# Patient Record
Sex: Male | Born: 1960
Health system: Southern US, Community
[De-identification: ages and names within clinical notes are randomized; demographics above are authoritative.]

## PROBLEM LIST (undated history)

## (undated) DIAGNOSIS — C689 Malignant neoplasm of urinary organ, unspecified: Secondary | ICD-10-CM

## (undated) DIAGNOSIS — E785 Hyperlipidemia, unspecified: Secondary | ICD-10-CM

## (undated) HISTORY — PX: APPENDECTOMY: SHX54

## (undated) HISTORY — PX: EXPLORATORY LAPAROTOMY: SUR591

---

## 2001-01-08 ENCOUNTER — Encounter: Payer: Self-pay | Admitting: Internal Medicine

## 2001-01-08 ENCOUNTER — Ambulatory Visit (HOSPITAL_COMMUNITY): Admission: RE | Admit: 2001-01-08 | Discharge: 2001-01-08 | Payer: Self-pay | Admitting: Internal Medicine

## 2003-01-26 ENCOUNTER — Encounter: Payer: Self-pay | Admitting: Internal Medicine

## 2003-01-26 ENCOUNTER — Ambulatory Visit (HOSPITAL_COMMUNITY): Admission: RE | Admit: 2003-01-26 | Discharge: 2003-01-26 | Payer: Self-pay | Admitting: Internal Medicine

## 2004-04-03 ENCOUNTER — Ambulatory Visit (HOSPITAL_COMMUNITY): Admission: RE | Admit: 2004-04-03 | Discharge: 2004-04-03 | Payer: Self-pay | Admitting: Internal Medicine

## 2005-01-02 ENCOUNTER — Ambulatory Visit (HOSPITAL_COMMUNITY): Admission: RE | Admit: 2005-01-02 | Discharge: 2005-01-02 | Payer: Self-pay | Admitting: Internal Medicine

## 2006-10-04 ENCOUNTER — Ambulatory Visit: Payer: Self-pay | Admitting: Cardiology

## 2006-10-22 ENCOUNTER — Ambulatory Visit: Payer: Self-pay

## 2010-09-19 NOTE — Assessment & Plan Note (Signed)
[facility]                            CARDIOLOGY OFFICE NOTE   NAME:Glorioso, FINNEUS KANESHIRO                    MRN:          [id number]  DATE:10/04/2006                            DOB:          03/05/1961    CARDIOLOGY CONSULTATION:  I was asked by Dr. Carylon Perches to consult on  Luis Pruitt, a very pleasant 50 year old married white male, who has a  cousin who has prolonged QT syndrome and risk of sudden cardiac death.   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  He does not exercise on a regular basis but  he enjoys walking and playing golf.   He has no previous cardiovascular history.  He has never had any  palpitations, presyncope or syncope.   He has had some atypical chest pain in the past and has had a treadmill  with a Cardiolite by Dr. Ouida Sills about 3 years ago.  He said it was  normal.  I do not have a copy of that.   He denies any symptoms consistent with any arrhythmias.   PAST MEDICAL HISTORY:  He has no dye allergy.  He has no allergy to any  medications.  He is currently on no medications.   His cardiac risk factors include male sex, age, tobacco use of a pack  per day for 30 years, and sedentary lifestyle.  He says his lipids are  good.   He does have a couple of alcoholic beverages per day.  He drinks a  couple of caffeinated beverages per day.   PREVIOUS SURGICAL HISTORY:  Appendectomy with acute pancreatitis and  exploratory surgery in 1980.   FAMILY HISTORY:  Negative for premature coronary disease.  I have a copy  of this letter from his cousin, who his family has apparently a history  of prolonged QT syndrome.   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  He is married and has 2 children.   REVIEW OF SYSTEMS:  Other than the HPI, only positive for allergies and  hay fever.  His other review of systems is  negative.   PHYSICAL EXAMINATION:  VITAL SIGNS:  His blood pressure is 129/80, his  pulse is 65 and regular.  His EKG is reviewed and is normal, including  his QT.  He has a little bit of early repolarization that is commented  on by Dr. Ouida Sills earlier.  He is 6 feet 1 inch and weighs 205 pounds.  HEENT:  Normocephalic, atraumatic.  PERRLA.  Extraocular movements  intact.  Sclerae are clear.  Facial symmetry is normal.  NECK:  Carotids are full.  There is no JVD.  Thyroid is not enlarged.  Trachea is midline.  There are negative bruits.  LUNGS:  Clear to auscultation.  HEART:  Nondisplaced PMI.  There is a normal S1, S2 without murmur, rub,  or gallop.  ABDOMEN:  Soft, good bowel sounds, no midline bruit.  There is no  hepatomegaly.  EXTREMITIES:  There is no  cyanosis, clubbing or edema.  Pulses are  brisk.  NEUROLOGIC:  Intact.  SKIN:  Intact.   I spent about 30 minutes with Alinda Money today.  I do not think he has  prolonged QT syndrome and there is really no true history of sudden  cardiac death in his family that I can gather.   I have reassured him of this today.  He also has no symptoms of ischemic  heart disease at the present time but does have significant risk  factors.  I spent a good amount of time talking to him about symptoms of  angina or ischemia and how to respond or react.  I also talked to him  about acute coronary syndrome and how to respond and react.  I have  encouraged him to stop smoking.  He has a prescription for Chantix but  has not used it.  I have encouraged him to do so.   We will obtain a 2 D echocardiogram to rule out any structural heart  disease or the unlikely diagnosis of a hypertrophic obstructive  cardiomyopathy.  If this is negative, I will see him back on a p.r.n.  basis.  I will turn him over to the good care of Dr. Ouida Sills.     Thomas C. Daleen Squibb, MD, [facility]  Electronically Signed    TCW/MedQ  DD: 10/04/2006  DT: 10/04/2006  Job #: [id number]   cc:    Kingsley Callander. Ouida Sills, MD

## 2010-09-22 NOTE — Procedures (Signed)
NAME:  Luis Pruitt, Luis Pruitt             ACCOUNT NO.:  [id number]   MEDICAL RECORD NO.:  [id number]          PATIENT TYPE:  OUT   LOCATION:  [facility]                          FACILITY:  [facility]   PHYSICIAN:  Kingsley Callander. Ouida Sills, MD       DATE OF BIRTH:  1961-04-08   DATE OF PROCEDURE:  DATE OF DISCHARGE:                                    STRESS TEST   DESCRIPTION OF PROCEDURE:  The patient exercised 12 minutes 31 seconds (31  seconds in stage V of the Bruce protocol) obtaining a maximum heart rate of  182 (103% of the age predicted maximum heart rate) and a work load of 12.8  METs.  He discontinued exercise due to fatigue.  There were no symptoms of  chest pain.  There were no arrhythmias.  There were no ST segment changes  diagnostic of ischemia.  The baseline electrocardiogram revealed normal  sinus rhythm at 74 beats/minute.   IMPRESSION:  No evidence of exercise induced ischemia.     Channing Mutters   ROF/MEDQ  D:  04/03/2004  T:  04/03/2004  Job:  [id number]

## 2011-04-25 ENCOUNTER — Other Ambulatory Visit (HOSPITAL_COMMUNITY): Payer: Self-pay | Admitting: Internal Medicine

## 2011-04-25 ENCOUNTER — Ambulatory Visit (HOSPITAL_COMMUNITY)
Admission: RE | Admit: 2011-04-25 | Discharge: 2011-04-25 | Disposition: A | Discharge status: Home / Self Care | Payer: BC Managed Care – PPO | Source: Ambulatory Visit | Attending: Internal Medicine | Admitting: Internal Medicine

## 2011-04-25 DIAGNOSIS — R059 Cough, unspecified: Secondary | ICD-10-CM | POA: Insufficient documentation

## 2011-04-25 DIAGNOSIS — R918 Other nonspecific abnormal finding of lung field: Secondary | ICD-10-CM | POA: Insufficient documentation

## 2011-04-25 DIAGNOSIS — R05 Cough: Secondary | ICD-10-CM | POA: Insufficient documentation

## 2011-08-01 ENCOUNTER — Encounter (INDEPENDENT_AMBULATORY_CARE_PROVIDER_SITE_OTHER): Payer: Self-pay | Admitting: *Deleted

## 2011-11-09 ENCOUNTER — Other Ambulatory Visit (HOSPITAL_COMMUNITY): Payer: Self-pay | Admitting: Internal Medicine

## 2011-11-09 DIAGNOSIS — R1031 Right lower quadrant pain: Secondary | ICD-10-CM

## 2011-11-19 ENCOUNTER — Ambulatory Visit (HOSPITAL_COMMUNITY)
Admission: RE | Admit: 2011-11-19 | Discharge: 2011-11-19 | Disposition: A | Discharge status: Home / Self Care | Payer: BC Managed Care – PPO | Source: Ambulatory Visit | Attending: Internal Medicine | Admitting: Internal Medicine

## 2011-11-19 DIAGNOSIS — R1031 Right lower quadrant pain: Secondary | ICD-10-CM | POA: Insufficient documentation

## 2012-01-02 ENCOUNTER — Telehealth (INDEPENDENT_AMBULATORY_CARE_PROVIDER_SITE_OTHER): Payer: Self-pay | Admitting: *Deleted

## 2012-01-02 ENCOUNTER — Encounter (INDEPENDENT_AMBULATORY_CARE_PROVIDER_SITE_OTHER): Payer: Self-pay | Admitting: *Deleted

## 2012-01-02 ENCOUNTER — Other Ambulatory Visit (INDEPENDENT_AMBULATORY_CARE_PROVIDER_SITE_OTHER): Payer: Self-pay | Admitting: *Deleted

## 2012-01-02 DIAGNOSIS — Z1211 Encounter for screening for malignant neoplasm of colon: Secondary | ICD-10-CM

## 2012-01-02 DIAGNOSIS — Z800 Family history of malignant neoplasm of digestive organs: Secondary | ICD-10-CM

## 2012-01-02 NOTE — Telephone Encounter (Signed)
Patient needs movi prep 

## 2012-01-04 MED ORDER — PEG-KCL-NACL-NASULF-NA ASC-C 100 G PO SOLR: 1.0000 | Freq: Once | ORAL | Status: DC

## 2012-02-13 ENCOUNTER — Telehealth (INDEPENDENT_AMBULATORY_CARE_PROVIDER_SITE_OTHER): Payer: Self-pay | Admitting: *Deleted

## 2012-02-13 NOTE — Telephone Encounter (Signed)
PCP/Requesting MD: fagan  Name & DOB: alexy heldt 11-01-1960   Procedure: tcs  Reason/Indication:  Screening, fam hx colon ca  Has patient had this procedure before?  no  If so, when, by whom and where?    Is there a family history of colon cancer?  yes  Who?  What age when diagnosed?  father  Is patient diabetic?   no      Does patient have prosthetic heart valve?  no  Do you have a pacemaker?  no  Has patient had joint replacement within last 12 months?  no  Is patient on Coumadin, Plavix and/or Aspirin? no  Medications: tylenol prn  Allergies: nkda  Medication Adjustment:   Procedure date & time: 02/28/12 at 830

## 2012-02-14 NOTE — Telephone Encounter (Signed)
agree

## 2012-02-20 ENCOUNTER — Encounter (HOSPITAL_COMMUNITY): Payer: Self-pay | Admitting: Pharmacy Technician

## 2012-02-27 MED ORDER — SODIUM CHLORIDE 0.45 % IV SOLN
INTRAVENOUS | Status: DC
  Administered 2012-02-28: 1000 mL via INTRAVENOUS

## 2012-02-28 ENCOUNTER — Encounter (HOSPITAL_COMMUNITY): Payer: Self-pay | Admitting: *Deleted

## 2012-02-28 ENCOUNTER — Encounter (HOSPITAL_COMMUNITY)
Admission: RE | Disposition: A | Discharge status: Home Health | Payer: Self-pay | Source: Ambulatory Visit | Attending: Internal Medicine

## 2012-02-28 ENCOUNTER — Ambulatory Visit (HOSPITAL_COMMUNITY)
Admission: RE | Admit: 2012-02-28 | Discharge: 2012-02-28 | Disposition: A | Discharge status: Home Health | Payer: BC Managed Care – PPO | Source: Ambulatory Visit | Attending: Internal Medicine | Admitting: Internal Medicine

## 2012-02-28 DIAGNOSIS — Z800 Family history of malignant neoplasm of digestive organs: Secondary | ICD-10-CM

## 2012-02-28 DIAGNOSIS — K5730 Diverticulosis of large intestine without perforation or abscess without bleeding: Secondary | ICD-10-CM

## 2012-02-28 DIAGNOSIS — D126 Benign neoplasm of colon, unspecified: Secondary | ICD-10-CM | POA: Insufficient documentation

## 2012-02-28 DIAGNOSIS — Z1211 Encounter for screening for malignant neoplasm of colon: Secondary | ICD-10-CM

## 2012-02-28 DIAGNOSIS — K644 Residual hemorrhoidal skin tags: Secondary | ICD-10-CM | POA: Insufficient documentation

## 2012-02-28 HISTORY — PX: COLONOSCOPY: SHX5424

## 2012-02-28 SURGERY — COLONOSCOPY
Anesthesia: Moderate Sedation

## 2012-02-28 MED ORDER — MIDAZOLAM HCL 5 MG/5ML IJ SOLN
INTRAMUSCULAR | Status: DC | PRN
  Administered 2012-02-28 (×3): 2 mg via INTRAVENOUS

## 2012-02-28 MED ORDER — MIDAZOLAM HCL 5 MG/5ML IJ SOLN
INTRAMUSCULAR | Status: AC
  Filled 2012-02-28: qty 10

## 2012-02-28 MED ORDER — MEPERIDINE HCL 50 MG/ML IJ SOLN
INTRAMUSCULAR | Status: DC | PRN
  Administered 2012-02-28 (×2): 25 mg via INTRAVENOUS

## 2012-02-28 MED ORDER — MEPERIDINE HCL 50 MG/ML IJ SOLN
INTRAMUSCULAR | Status: AC
  Filled 2012-02-28: qty 1

## 2012-02-28 NOTE — H&P (Signed)
Luis Pruitt is an 51 y.o. male.   Chief Complaint: Patient is here for colonoscopy. HPI: Patient is 51 year old Caucasian male who is here for screening colonoscopy. This is patient's first exam. He denies abdominal pain change in his bowel habits or rectal bleeding. Family history significant for colon carcinoma in his father but he was in his late 59s at the time of diagnosis.  History reviewed. No pertinent past medical history.  Past Surgical History  Procedure Date  . Exploratory laparotomy     with appendectomy  . Appendectomy     Family History  Problem Relation Age of Onset  . Colon cancer Father    Social History:  reports that he has been smoking Cigarettes.  He has been smoking about 1 pack per day. He does not have any smokeless tobacco history on file. His alcohol and drug histories not on file.  Allergies: No Known Allergies  Medications Prior to Admission  Medication Sig Dispense Refill  . peg 3350 powder (MOVIPREP) 100 G SOLR Take 1 kit (100 g total) by mouth once.  1 kit  0    No results found for this or any previous visit (from the past 48 hour(s)). No results found.  ROS  Blood pressure 131/82, pulse 75, temperature 97.7 F (36.5 C), temperature source Oral, resp. rate 18, height 6\' 1"  (1.854 m), weight 215 lb (97.523 kg), SpO2 97.00%. Physical Exam  Constitutional: He appears well-developed and well-nourished.  HENT:  Mouth/Throat: Oropharynx is clear and moist.  Eyes: Conjunctivae normal are normal. No scleral icterus.  Neck: No thyromegaly present.  Cardiovascular: Normal rate, regular rhythm and normal heart sounds.   No murmur heard. Respiratory: Breath sounds normal.  GI: He exhibits no distension and no mass. There is no tenderness.  Musculoskeletal: He exhibits no edema.  Lymphadenopathy:    He has no cervical adenopathy.  Neurological: He is alert.  Skin: Skin is warm and dry.     Assessment/Plan Screening colonoscopy. Family  history of colon carcinoma in her father who is in his late 33s at the time of diagnosis.   Paymon Rosensteel U 02/28/2012, 8:30 AM

## 2012-02-28 NOTE — Progress Notes (Signed)
Call patient at [phone]

## 2012-02-28 NOTE — Op Note (Signed)
COLONOSCOPY PROCEDURE REPORT  PATIENT:  Luis Pruitt  MR#:  [id number] Birthdate:  1960-06-21, 51 y.o., male Endoscopist:  Dr. Malissa Hippo, MD Referred By:  Dr. Carylon Perches, MD Procedure Date: 02/28/2012  Procedure:   Colonoscopy  Indications:  Patient is 51 year old Caucasian male who is undergoing screening colonoscopy. His risk would be considered ablated since his father had colon carcinoma in his late 22s.  Informed Consent:  The procedure and risks were reviewed with the patient and informed consent was obtained.  Medications:  Demerol 50 mg IV Versed 6 mg IV  Description of procedure:  After a digital rectal exam was performed, that colonoscope was advanced from the anus through the rectum and colon to the area of the cecum, ileocecal valve and appendiceal orifice. The cecum was deeply intubated. These structures were well-seen and photographed for the record. From the level of the cecum and ileocecal valve, the scope was slowly and cautiously withdrawn. The mucosal surfaces were carefully surveyed utilizing scope tip to flexion to facilitate fold flattening as needed. The scope was pulled down into the rectum where a thorough exam including retroflexion was performed.  Findings:   Prep satisfactory. Two small diverticula noted; one at cecum and the second one at sigmoid colon. Two small polyps ablated via cold biopsy and submitted together. These are located at splenic flexure and sigmoid colon. Normal rectal mucosa. Small hemorrhoids below the dentate line.  Therapeutic/Diagnostic Maneuvers Performed:  See above  Complications:  None  Cecal Withdrawal Time:  15 minutes  Impression:  Examination performed to cecum. Two small polyps ablated via cold biopsy and submitted together as above. Two small diverticula. One was at cecum and the second one at sigmoid colon. Small external hemorrhoids.  Recommendations:  Standard instructions given. I will contact patient  with biopsy results and further recommendations.  Sharah Finnell U  02/28/2012 9:07 AM  CC: Dr. Carylon Perches, MD & Dr. Bonnetta Barry ref. provider found

## 2012-03-05 ENCOUNTER — Encounter (HOSPITAL_COMMUNITY): Payer: Self-pay | Admitting: Internal Medicine

## 2012-03-07 ENCOUNTER — Encounter (INDEPENDENT_AMBULATORY_CARE_PROVIDER_SITE_OTHER): Payer: Self-pay | Admitting: *Deleted

## 2013-03-30 ENCOUNTER — Ambulatory Visit (HOSPITAL_COMMUNITY)
Admission: RE | Admit: 2013-03-30 | Discharge: 2013-03-30 | Disposition: A | Discharge status: Home / Self Care | Payer: BC Managed Care – PPO | Source: Ambulatory Visit | Attending: Internal Medicine | Admitting: Internal Medicine

## 2013-03-30 ENCOUNTER — Other Ambulatory Visit (HOSPITAL_COMMUNITY): Payer: Self-pay | Admitting: Internal Medicine

## 2013-03-30 DIAGNOSIS — M25552 Pain in left hip: Secondary | ICD-10-CM

## 2013-03-30 DIAGNOSIS — M25559 Pain in unspecified hip: Secondary | ICD-10-CM | POA: Insufficient documentation

## 2013-06-15 ENCOUNTER — Other Ambulatory Visit (HOSPITAL_COMMUNITY): Payer: Self-pay | Admitting: Internal Medicine

## 2013-06-15 ENCOUNTER — Ambulatory Visit (HOSPITAL_COMMUNITY)
Admission: RE | Admit: 2013-06-15 | Discharge: 2013-06-15 | Disposition: A | Discharge status: Home / Self Care | Payer: BC Managed Care – PPO | Source: Ambulatory Visit | Attending: Internal Medicine | Admitting: Internal Medicine

## 2013-06-15 DIAGNOSIS — R059 Cough, unspecified: Secondary | ICD-10-CM | POA: Insufficient documentation

## 2013-06-15 DIAGNOSIS — R05 Cough: Secondary | ICD-10-CM

## 2014-06-25 ENCOUNTER — Other Ambulatory Visit (HOSPITAL_COMMUNITY): Payer: Self-pay | Admitting: Internal Medicine

## 2014-06-25 ENCOUNTER — Ambulatory Visit (HOSPITAL_COMMUNITY)
Admission: RE | Admit: 2014-06-25 | Discharge: 2014-06-25 | Disposition: A | Discharge status: Home / Self Care | Payer: BLUE CROSS/BLUE SHIELD | Source: Ambulatory Visit | Attending: Internal Medicine | Admitting: Internal Medicine

## 2014-06-25 DIAGNOSIS — W19XXXA Unspecified fall, initial encounter: Secondary | ICD-10-CM | POA: Diagnosis not present

## 2014-06-25 DIAGNOSIS — M25531 Pain in right wrist: Secondary | ICD-10-CM | POA: Diagnosis present

## 2014-06-25 DIAGNOSIS — R52 Pain, unspecified: Secondary | ICD-10-CM

## 2015-02-03 ENCOUNTER — Other Ambulatory Visit (HOSPITAL_COMMUNITY): Payer: Self-pay | Admitting: Internal Medicine

## 2015-02-03 DIAGNOSIS — IMO0001 Reserved for inherently not codable concepts without codable children: Secondary | ICD-10-CM

## 2015-02-09 ENCOUNTER — Ambulatory Visit (HOSPITAL_COMMUNITY)
Admission: RE | Admit: 2015-02-09 | Discharge: 2015-02-09 | Disposition: A | Discharge status: Home / Self Care | Payer: BLUE CROSS/BLUE SHIELD | Source: Ambulatory Visit | Attending: Internal Medicine | Admitting: Internal Medicine

## 2015-02-09 DIAGNOSIS — R932 Abnormal findings on diagnostic imaging of liver and biliary tract: Secondary | ICD-10-CM | POA: Insufficient documentation

## 2015-02-09 DIAGNOSIS — R140 Abdominal distension (gaseous): Secondary | ICD-10-CM | POA: Diagnosis present

## 2015-02-09 DIAGNOSIS — IMO0001 Reserved for inherently not codable concepts without codable children: Secondary | ICD-10-CM

## 2015-09-22 DIAGNOSIS — J019 Acute sinusitis, unspecified: Secondary | ICD-10-CM | POA: Diagnosis not present

## 2015-11-22 ENCOUNTER — Other Ambulatory Visit (HOSPITAL_COMMUNITY): Payer: Self-pay | Admitting: Internal Medicine

## 2015-11-22 ENCOUNTER — Ambulatory Visit (HOSPITAL_COMMUNITY)
Admission: RE | Admit: 2015-11-22 | Discharge: 2015-11-22 | Disposition: A | Discharge status: Home / Self Care | Payer: BLUE CROSS/BLUE SHIELD | Source: Ambulatory Visit | Attending: Internal Medicine | Admitting: Internal Medicine

## 2015-11-22 DIAGNOSIS — R079 Chest pain, unspecified: Secondary | ICD-10-CM | POA: Diagnosis not present

## 2015-12-30 DIAGNOSIS — M9902 Segmental and somatic dysfunction of thoracic region: Secondary | ICD-10-CM | POA: Diagnosis not present

## 2015-12-30 DIAGNOSIS — M545 Low back pain: Secondary | ICD-10-CM | POA: Diagnosis not present

## 2015-12-30 DIAGNOSIS — M9901 Segmental and somatic dysfunction of cervical region: Secondary | ICD-10-CM | POA: Diagnosis not present

## 2015-12-30 DIAGNOSIS — M9903 Segmental and somatic dysfunction of lumbar region: Secondary | ICD-10-CM | POA: Diagnosis not present

## 2016-01-03 DIAGNOSIS — M545 Low back pain: Secondary | ICD-10-CM | POA: Diagnosis not present

## 2016-03-21 DIAGNOSIS — L570 Actinic keratosis: Secondary | ICD-10-CM | POA: Diagnosis not present

## 2016-03-21 DIAGNOSIS — L739 Follicular disorder, unspecified: Secondary | ICD-10-CM | POA: Diagnosis not present

## 2016-03-27 IMAGING — CR DG WRIST COMPLETE 3+V*R*
4 series · 4 of 4 positions shown · non-contrast
Comparison: Right hand series of today's date

CLINICAL DATA: Status post fall 2 weeks ago ; persistent pain
radiating into the fourth and fifth digits as well as radiating into
the forearm.

EXAM:
RIGHT WRIST - COMPLETE 3+ VIEW

[view not recorded (1 of 4)]
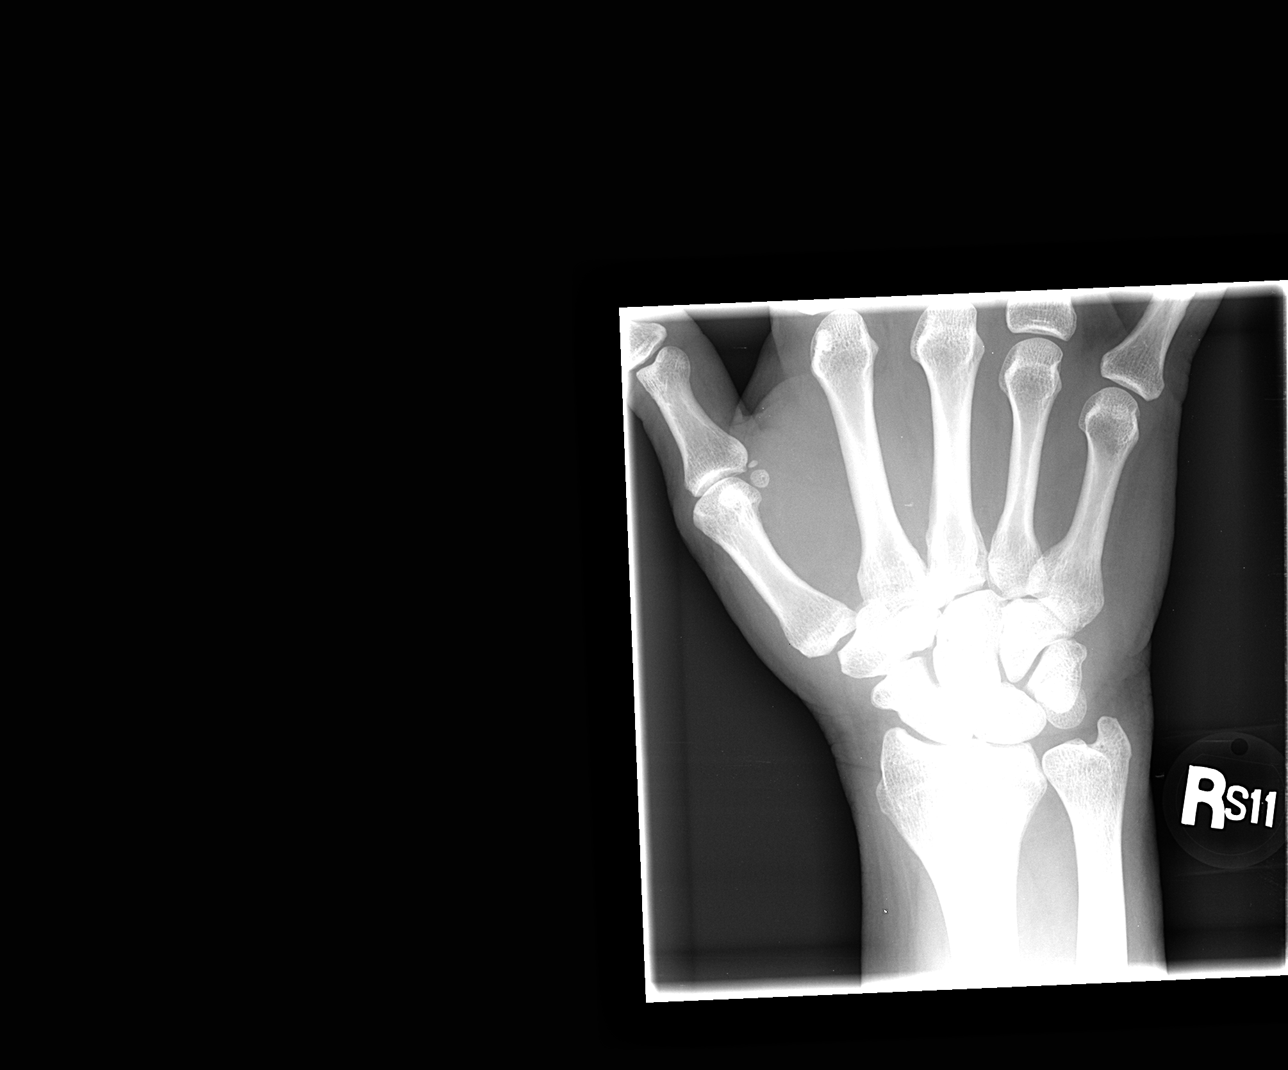

[view not recorded (2 of 4)]
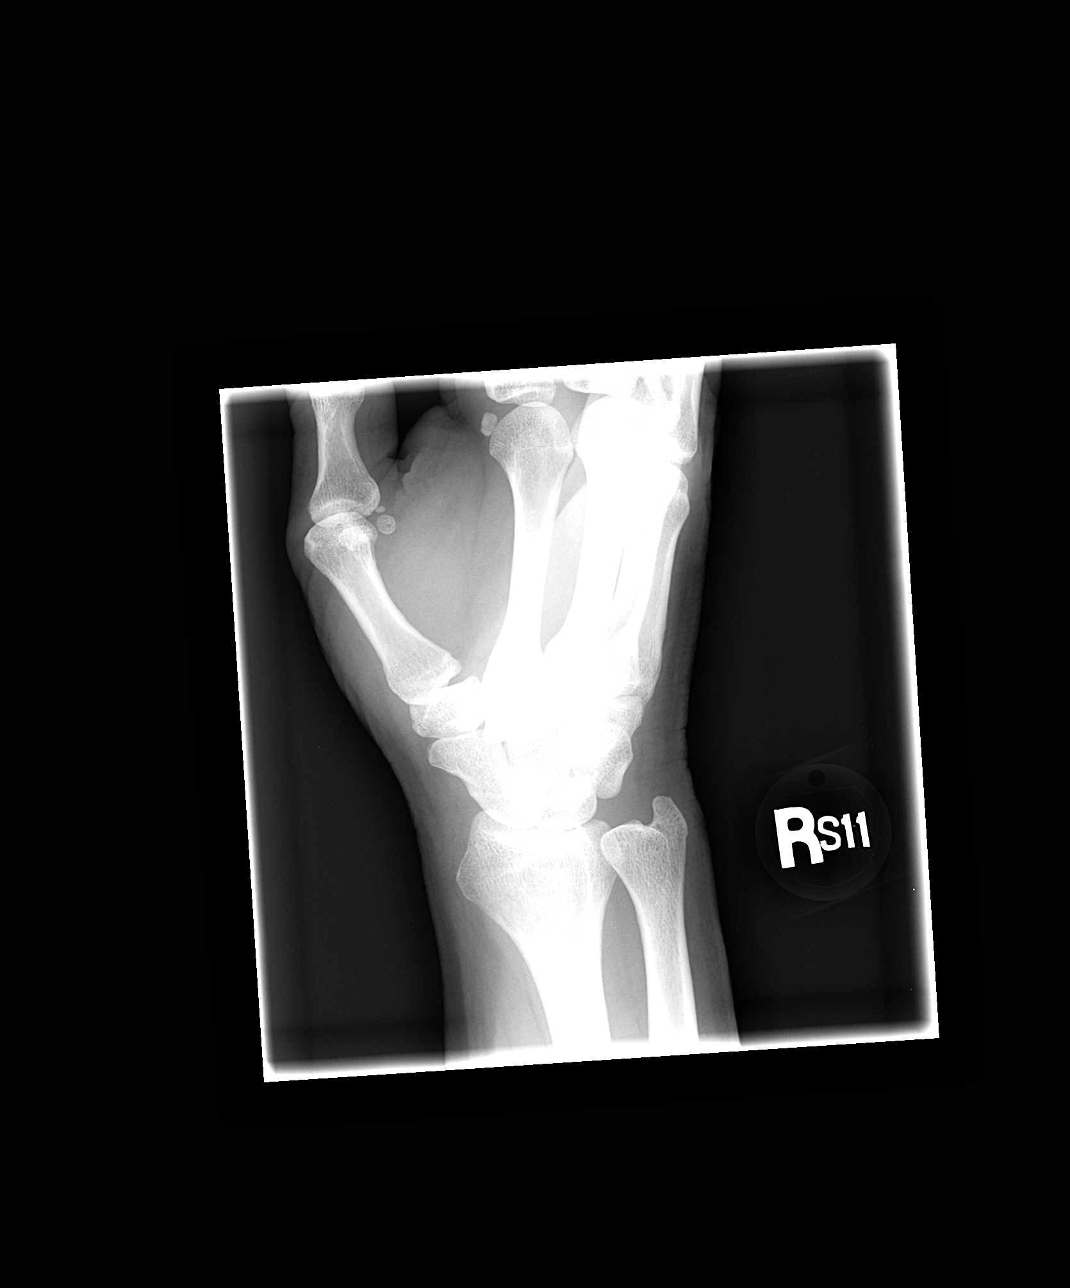

[view not recorded (3 of 4)]
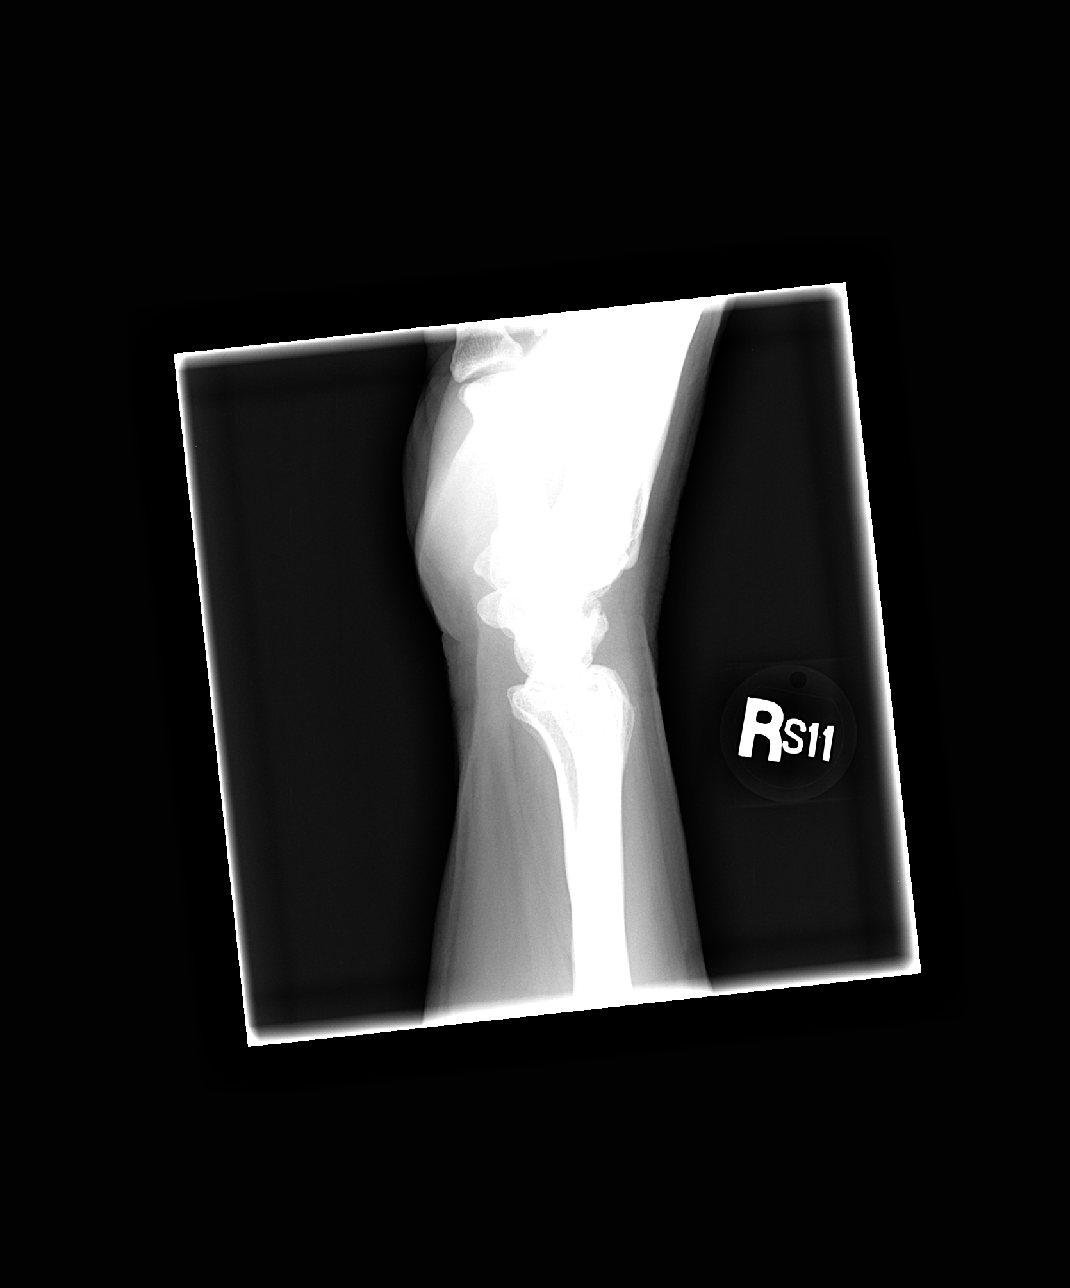

[view not recorded (4 of 4)]
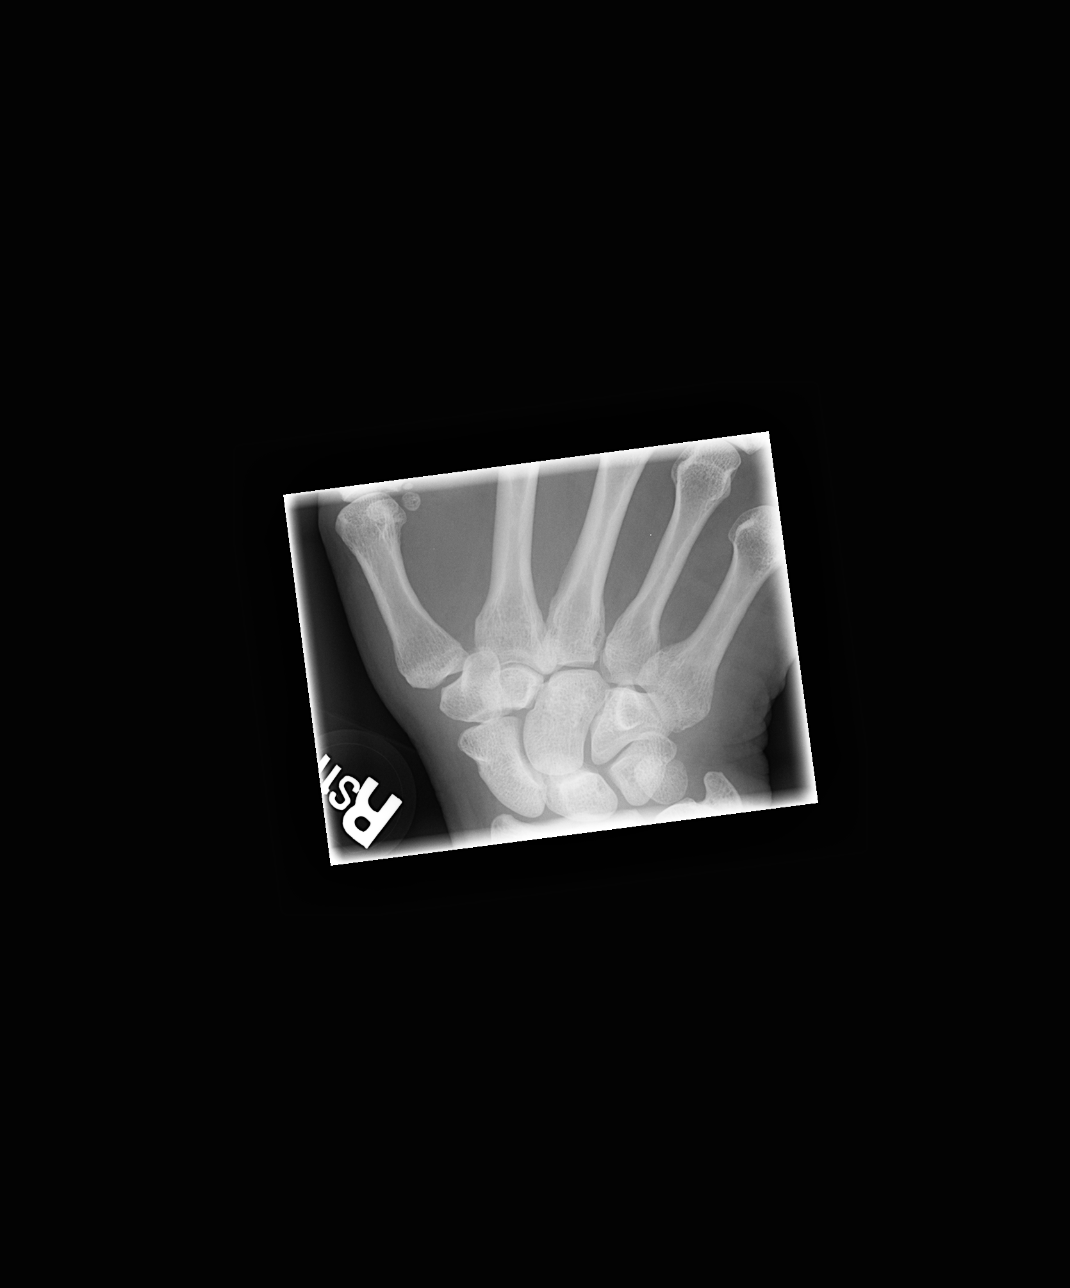

[4 of 4 positions shown; findings below may reference images not displayed]

FINDINGS: The bones of the wrist are adequately mineralized. There is no acute
fracture nor dislocation. Specific attention to the scaphoid reveals
no acute abnormality. The radiocarpal and ulnocarpal and intercarpal
joints are unremarkable. The carpometacarpal joints are normal as
well. The soft tissues exhibit no abnormal densities.
IMPRESSION: There is no evidence of an acute or healing fracture nor other acute
bony abnormality of the right wrist.

## 2016-04-06 DIAGNOSIS — Z23 Encounter for immunization: Secondary | ICD-10-CM | POA: Diagnosis not present

## 2016-05-17 DIAGNOSIS — Z125 Encounter for screening for malignant neoplasm of prostate: Secondary | ICD-10-CM | POA: Diagnosis not present

## 2016-05-17 DIAGNOSIS — E785 Hyperlipidemia, unspecified: Secondary | ICD-10-CM | POA: Diagnosis not present

## 2016-05-17 DIAGNOSIS — Z79899 Other long term (current) drug therapy: Secondary | ICD-10-CM | POA: Diagnosis not present

## 2016-05-17 DIAGNOSIS — R7301 Impaired fasting glucose: Secondary | ICD-10-CM | POA: Diagnosis not present

## 2016-05-22 DIAGNOSIS — K760 Fatty (change of) liver, not elsewhere classified: Secondary | ICD-10-CM | POA: Diagnosis not present

## 2016-05-22 DIAGNOSIS — R7301 Impaired fasting glucose: Secondary | ICD-10-CM | POA: Diagnosis not present

## 2016-05-22 DIAGNOSIS — E785 Hyperlipidemia, unspecified: Secondary | ICD-10-CM | POA: Diagnosis not present

## 2016-05-22 DIAGNOSIS — Z0001 Encounter for general adult medical examination with abnormal findings: Secondary | ICD-10-CM | POA: Diagnosis not present

## 2016-12-06 DIAGNOSIS — D225 Melanocytic nevi of trunk: Secondary | ICD-10-CM | POA: Diagnosis not present

## 2016-12-06 DIAGNOSIS — L821 Other seborrheic keratosis: Secondary | ICD-10-CM | POA: Diagnosis not present

## 2016-12-06 DIAGNOSIS — L84 Corns and callosities: Secondary | ICD-10-CM | POA: Diagnosis not present

## 2017-02-01 ENCOUNTER — Encounter (INDEPENDENT_AMBULATORY_CARE_PROVIDER_SITE_OTHER): Payer: Self-pay | Admitting: *Deleted

## 2017-02-05 DIAGNOSIS — Z23 Encounter for immunization: Secondary | ICD-10-CM | POA: Diagnosis not present

## 2017-02-18 DIAGNOSIS — J209 Acute bronchitis, unspecified: Secondary | ICD-10-CM | POA: Diagnosis not present

## 2017-04-03 ENCOUNTER — Encounter (INDEPENDENT_AMBULATORY_CARE_PROVIDER_SITE_OTHER): Payer: Self-pay | Admitting: *Deleted

## 2017-04-03 ENCOUNTER — Other Ambulatory Visit (INDEPENDENT_AMBULATORY_CARE_PROVIDER_SITE_OTHER): Payer: Self-pay | Admitting: *Deleted

## 2017-04-03 ENCOUNTER — Telehealth (INDEPENDENT_AMBULATORY_CARE_PROVIDER_SITE_OTHER): Payer: Self-pay | Admitting: *Deleted

## 2017-04-03 DIAGNOSIS — Z800 Family history of malignant neoplasm of digestive organs: Secondary | ICD-10-CM

## 2017-04-03 MED ORDER — PEG-KCL-NACL-NASULF-NA ASC-C 140 G PO SOLR: 1.0000 | Freq: Once | ORAL | 0 refills | Status: AC

## 2017-04-03 NOTE — Telephone Encounter (Signed)
Patient needs plenvu 

## 2017-04-05 ENCOUNTER — Telehealth (INDEPENDENT_AMBULATORY_CARE_PROVIDER_SITE_OTHER): Payer: Self-pay | Admitting: *Deleted

## 2017-04-05 NOTE — Telephone Encounter (Signed)
Referring MD/PCP: fagan   Procedure: tcs  Reason/Indication:  fam hx colon ca  Has patient had this procedure before?  yesm 2013  If so, when, by whom and where?    Is there a family history of colon cancer?  Yes, father  Who?  What age when diagnosed?    Is patient diabetic?   no      Does patient have prosthetic heart valve or mechanical valve?  no  Do you have a pacemaker?  no  Has patient ever had endocarditis? no  Has patient had joint replacement within last 12 months?  no  Is patient constipated or take laxatives? no  Does patient have a history of alcohol/drug use?  no  Is patient on Coumadin, Plavix and/or Aspirin? no  Medications: none  Allergies: nkda  Medication Adjustment per Dr Laural Golden:   Procedure date & time: 05/03/17 at 200

## 2017-04-08 NOTE — Telephone Encounter (Signed)
agree

## 2017-05-03 ENCOUNTER — Encounter (HOSPITAL_COMMUNITY): Payer: Self-pay | Admitting: *Deleted

## 2017-05-03 ENCOUNTER — Ambulatory Visit (HOSPITAL_COMMUNITY)
Admission: RE | Admit: 2017-05-03 | Discharge: 2017-05-03 | Disposition: A | Discharge status: Home / Self Care | Payer: BLUE CROSS/BLUE SHIELD | Source: Ambulatory Visit | Attending: Internal Medicine | Admitting: Internal Medicine

## 2017-05-03 ENCOUNTER — Encounter (HOSPITAL_COMMUNITY)
Admission: RE | Disposition: A | Discharge status: Home / Self Care | Payer: Self-pay | Source: Ambulatory Visit | Attending: Internal Medicine

## 2017-05-03 ENCOUNTER — Other Ambulatory Visit: Payer: Self-pay

## 2017-05-03 DIAGNOSIS — Z1211 Encounter for screening for malignant neoplasm of colon: Secondary | ICD-10-CM | POA: Diagnosis not present

## 2017-05-03 DIAGNOSIS — Z86010 Personal history of colonic polyps: Secondary | ICD-10-CM | POA: Insufficient documentation

## 2017-05-03 DIAGNOSIS — K644 Residual hemorrhoidal skin tags: Secondary | ICD-10-CM | POA: Diagnosis not present

## 2017-05-03 DIAGNOSIS — D125 Benign neoplasm of sigmoid colon: Secondary | ICD-10-CM | POA: Diagnosis not present

## 2017-05-03 DIAGNOSIS — F17210 Nicotine dependence, cigarettes, uncomplicated: Secondary | ICD-10-CM | POA: Insufficient documentation

## 2017-05-03 DIAGNOSIS — Z800 Family history of malignant neoplasm of digestive organs: Secondary | ICD-10-CM | POA: Diagnosis not present

## 2017-05-03 DIAGNOSIS — Z09 Encounter for follow-up examination after completed treatment for conditions other than malignant neoplasm: Secondary | ICD-10-CM | POA: Diagnosis not present

## 2017-05-03 HISTORY — PX: COLONOSCOPY: SHX5424

## 2017-05-03 SURGERY — COLONOSCOPY
Anesthesia: Moderate Sedation

## 2017-05-03 MED ORDER — SODIUM CHLORIDE 0.9 % IV SOLN
INTRAVENOUS | Status: DC
  Administered 2017-05-03: 1000 mL via INTRAVENOUS

## 2017-05-03 MED ORDER — MIDAZOLAM HCL 5 MG/5ML IJ SOLN
INTRAMUSCULAR | Status: DC | PRN
  Administered 2017-05-03 (×5): 2 mg via INTRAVENOUS

## 2017-05-03 MED ORDER — MEPERIDINE HCL 50 MG/ML IJ SOLN
INTRAMUSCULAR | Status: DC | PRN
  Administered 2017-05-03 (×2): 25 mg via INTRAVENOUS

## 2017-05-03 MED ORDER — STERILE WATER FOR IRRIGATION IR SOLN
Status: DC | PRN
  Administered 2017-05-03: 100 mL

## 2017-05-03 NOTE — Discharge Instructions (Signed)
No aspirin or NSAIDs for 48 hours. Resume usual diet. No driving for 24 hours. Physician will call with biopsy results. Next colonoscopy in 5 years.   Colonoscopy, Adult, Care After This sheet gives you information about how to care for yourself after your procedure. Your doctor may also give you more specific instructions. If you have problems or questions, call your doctor. Follow these instructions at home: General instructions   For the first 24 hours after the procedure: ? Do not drive or use machinery. ? Do not sign important documents. ? Do not drink alcohol. ? Do your daily activities more slowly than normal. ? Eat foods that are soft and easy to digest. ? Rest often.  Take over-the-counter or prescription medicines only as told by your doctor.  It is up to you to get the results of your procedure. Ask your doctor, or the department performing the procedure, when your results will be ready. To help cramping and bloating:  Try walking around.  Put heat on your belly (abdomen) as told by your doctor. Use a heat source that your doctor recommends, such as a moist heat pack or a heating pad. ? Put a towel between your skin and the heat source. ? Leave the heat on for 20-30 minutes. ? Remove the heat if your skin turns bright red. This is especially important if you cannot feel pain, heat, or cold. You can get burned. Eating and drinking  Drink enough fluid to keep your pee (urine) clear or pale yellow.  Return to your normal diet as told by your doctor. Avoid heavy or fried foods that are hard to digest.  Avoid drinking alcohol for as long as told by your doctor. Contact a doctor if:  You have blood in your poop (stool) 2-3 days after the procedure. Get help right away if:  You have more than a small amount of blood in your poop.  You see large clumps of tissue (blood clots) in your poop.  Your belly is swollen.  You feel sick to your stomach (nauseous).  You  throw up (vomit).  You have a fever.  You have belly pain that gets worse, and medicine does not help your pain. This information is not intended to replace advice given to you by your health care provider. Make sure you discuss any questions you have with your health care provider.    Colon Polyps Polyps are tissue growths inside the body. Polyps can grow in many places, including the large intestine (colon). A polyp may be a round bump or a mushroom-shaped growth. You could have one polyp or several. Most colon polyps are noncancerous (benign). However, some colon polyps can become cancerous over time. What are the causes? The exact cause of colon polyps is not known. What increases the risk? This condition is more likely to develop in people who:  Have a family history of colon cancer or colon polyps.  Are older than 32 or older than 45 if they are African American.  Have inflammatory bowel disease, such as ulcerative colitis or Crohn disease.  Are overweight.  Smoke cigarettes.  Do not get enough exercise.  Drink too much alcohol.  Eat a diet that is: ? High in fat and red meat. ? Low in fiber.  Had childhood cancer that was treated with abdominal radiation.  What are the signs or symptoms? Most polyps do not cause symptoms. If you have symptoms, they may include:  Blood coming from your rectum when  having a bowel movement.  Blood in your stool.The stool may look dark red or black.  A change in bowel habits, such as constipation or diarrhea.  How is this diagnosed? This condition is diagnosed with a colonoscopy. This is a procedure that uses a lighted, flexible scope to look at the inside of your colon. How is this treated? Treatment for this condition involves removing any polyps that are found. Those polyps will then be tested for cancer. If cancer is found, your health care provider will talk to you about options for colon cancer treatment. Follow these  instructions at home: Diet  Eat plenty of fiber, such as fruits, vegetables, and whole grains.  Eat foods that are high in calcium and vitamin D, such as milk, cheese, yogurt, eggs, liver, fish, and broccoli.  Limit foods high in fat, red meats, and processed meats, such as hot dogs, sausage, bacon, and lunch meats.  Maintain a healthy weight, or lose weight if recommended by your health care provider. General instructions  Do not smoke cigarettes.  Do not drink alcohol excessively.  Keep all follow-up visits as told by your health care provider. This is important. This includes keeping regularly scheduled colonoscopies. Talk to your health care provider about when you need a colonoscopy.  Exercise every day or as told by your health care provider. Contact a health care provider if:  You have new or worsening bleeding during a bowel movement.  You have new or increased blood in your stool.  You have a change in bowel habits.  You unexpectedly lose weight. This information is not intended to replace advice given to you by your health care provider. Make sure you discuss any questions you have with your health care provider.    Hemorrhoids Hemorrhoids are swollen veins in and around the rectum or anus. Hemorrhoids can cause pain, itching, or bleeding. Most of the time, they do not cause serious problems. They usually get better with diet changes, lifestyle changes, and other home treatments. Follow these instructions at home: Eating and drinking  Eat foods that have fiber, such as whole grains, beans, nuts, fruits, and vegetables. Ask your doctor about taking products that have added fiber (fibersupplements).  Drink enough fluid to keep your pee (urine) clear or pale yellow. For Pain and Swelling  Take a warm-water bath (sitz bath) for 20 minutes to ease pain. Do this 3-4 times a day.  If directed, put ice on the painful area. It may be helpful to use ice between your warm  baths. ? Put ice in a plastic bag. ? Place a towel between your skin and the bag. ? Leave the ice on for 20 minutes, 2-3 times a day. General instructions  Take over-the-counter and prescription medicines only as told by your doctor. ? Medicated creams and medicines that are inserted into the anus (suppositories) may be used or applied as told.  Exercise often.  Go to the bathroom when you have the urge to poop (to have a bowel movement). Do not wait.  Avoid pushing too hard (straining) when you poop.  Keep the butt area dry and clean. Use wet toilet paper or moist paper towels.  Do not sit on the toilet for a long time. Contact a doctor if:  You have any of these: ? Pain and swelling that do not get better with treatment or medicine. ? Bleeding that will not stop. ? Trouble pooping or you cannot poop. ? Pain or swelling outside the area  of the hemorrhoids. This information is not intended to replace advice given to you by your health care provider. Make sure you discuss any questions you have with your health care provider.

## 2017-05-03 NOTE — H&P (Signed)
Luis Pruitt is an 56 y.o. male.   Chief Complaint: Patient is here for colonoscopy. HPI: Patient is 56 year old Caucasian male who has a history of colonic adenomas and is here for surveillance colonoscopy.  He denies abdominal pain change in bowel habits or rectal bleeding. Last colonoscopy was about 5 years ago with removal of 2 small tubular adenomas.  Family history significant for CRC in his father who was in his early 71s at the time of diagnosis and lived to be 74.  History reviewed. No pertinent past medical history.  Past Surgical History:  Procedure Laterality Date  . APPENDECTOMY    . COLONOSCOPY  02/28/2012   Procedure: COLONOSCOPY;  Surgeon: Rogene Houston, MD;  Location: [facility];  Service: Endoscopy;  Laterality: N/A;  830  . EXPLORATORY LAPAROTOMY     with appendectomy    Family History  Problem Relation Age of Onset  . Colon cancer Father   . Melanoma Father   . COPD Mother   . Multiple sclerosis Sister   . Brain cancer Brother    Social History:  reports that he has been smoking cigarettes.  He has been smoking about 1.00 pack per day. he has never used smokeless tobacco. He reports that he drinks alcohol. He reports that he does not use drugs.  Allergies: No Known Allergies  No medications prior to admission.    No results found for this or any previous visit (from the past 48 hour(s)). No results found.  ROS  Blood pressure 115/83, pulse 74, temperature 98.3 F (36.8 C), temperature source Oral, resp. rate 16, height 6\' 2"  (1.88 m), weight 220 lb (99.8 kg), SpO2 97 %. Physical Exam  Constitutional: He appears well-developed and well-nourished.  HENT:  Mouth/Throat: Oropharynx is clear and moist.  Eyes: Conjunctivae are normal. No scleral icterus.  Neck: No thyromegaly present.  Cardiovascular: Normal rate and regular rhythm.  Murmur heard. Faint SEM at the left lower sternal border.  Respiratory: Effort normal.  GI: Soft. He exhibits no  distension and no mass. There is no tenderness.  Lymphadenopathy:    He has no cervical adenopathy.     Assessment/Plan History of colonic adenomas. Family history of CRC in first-degree relative. Surveillance colonoscopy  Hildred Laser, MD 05/03/2017, 11:43 AM

## 2017-05-03 NOTE — Op Note (Addendum)
[facility] Patient Name: Luis Pruitt Procedure Date: 05/03/2017 11:18 AM MRN: [id number] Date of Birth: 1960-10-28 Attending MD: Hildred Laser , MD CSN: [id number] Age: 56 Admit Type: Ambulatory Procedure:                Colonoscopy Indications:              High risk colon cancer surveillance: Personal                            history of colonic polyps, Family history of colon                            cancer in a first-degree relative Providers:                Hildred Laser, MD, Lurline Del, RN, Rosina Lowenstein, RN Referring MD:             Asencion Noble, MD Medicines:                Meperidine 50 mg IV, Midazolam 10 mg IV Complications:            No immediate complications. Estimated Blood Loss:     Estimated blood loss was minimal. Procedure:                Pre-Anesthesia Assessment:                           - Prior to the procedure, a History and Physical                            was performed, and patient medications and                            allergies were reviewed. The patient's tolerance of                            previous anesthesia was also reviewed. The risks                            and benefits of the procedure and the sedation                            options and risks were discussed with the patient.                            All questions were answered, and informed consent                            was obtained. Prior Anticoagulants: The patient has                            taken no previous anticoagulant or antiplatelet                            agents. ASA Grade Assessment: I - A normal, healthy  patient. After reviewing the risks and benefits,                            the patient was deemed in satisfactory condition to                            undergo the procedure.                           After obtaining informed consent, the colonoscope                            was passed under direct vision. Throughout  the                            procedure, the patient's blood pressure, pulse, and                            oxygen saturations were monitored continuously. The                            EC-[id number] ([id number]) scope was introduced through                            the anus and advanced to the the cecum, identified                            by appendiceal orifice and ileocecal valve. The                            colonoscopy was performed without difficulty. The                            patient tolerated the procedure well. The quality                            of the bowel preparation was excellent. The                            ileocecal valve, appendiceal orifice, and rectum                            were photographed. Scope In: 11:56:27 AM Scope Out: 12:31:57 PM Scope Withdrawal Time: 0 hours 25 minutes 51 seconds  Total Procedure Duration: 0 hours 35 minutes 30 seconds  Findings:      The perianal and digital rectal examinations were normal.      A small polyp was found in the sigmoid colon. The polyp was sessile. The       polyp was removed with a cold snare. Resection and retrieval were       complete. The pathology specimen was placed into Bottle Number 1.      The exam was otherwise normal throughout the examined colon.      External hemorrhoids were found during retroflexion. The hemorrhoids       were  small. Impression:               - One small polyp in the sigmoid colon, removed                            with a cold snare. Resected and retrieved.                           - External hemorrhoids. Moderate Sedation:      Moderate (conscious) sedation was administered by the endoscopy nurse       and supervised by the endoscopist. The following parameters were       monitored: oxygen saturation, heart rate, blood pressure, CO2       capnography and response to care. Total physician intraservice time was       44 minutes. Recommendation:           - Patient has a  contact number available for                            emergencies. The signs and symptoms of potential                            delayed complications were discussed with the                            patient. Return to normal activities tomorrow.                            Written discharge instructions were provided to the                            patient.                           - Resume previous diet today.                           - Continue present medications.                           - No aspirin, ibuprofen, naproxen, or other                            non-steroidal anti-inflammatory drugs for 2 days.                           - Await pathology results.                           - Repeat colonoscopy in 5 years for surveillance. Procedure Code(s):        --- Professional ---                           [phone], Colonoscopy, flexible; with removal of  tumor(s), polyp(s), or other lesion(s) by snare                            technique                           99152, Moderate sedation services provided by the                            same physician or other qualified health care                            professional performing the diagnostic or                            therapeutic service that the sedation supports,                            requiring the presence of an independent trained                            observer to assist in the monitoring of the                            patient's level of consciousness and physiological                            status; initial 15 minutes of intraservice time,                            patient age 10 years or older                           [phone], Moderate sedation services; each additional                            15 minutes intraservice time                           99153, Moderate sedation services; each additional                            15 minutes intraservice time Diagnosis  Code(s):        --- Professional ---                           Z86.010, Personal history of colonic polyps                           D12.5, Benign neoplasm of sigmoid colon                           K64.4, Residual hemorrhoidal skin tags                           Z80.0, Family history of  malignant neoplasm of                            digestive organs CPT copyright 2016 [employer]. All rights reserved. The codes documented in this report are preliminary and upon coder review may  be revised to meet current compliance requirements. Hildred Laser, MD Hildred Laser, MD 05/03/2017 12:48:56 PM This report has been signed electronically. Number of Addenda: 0

## 2017-05-08 ENCOUNTER — Encounter (HOSPITAL_COMMUNITY): Payer: Self-pay | Admitting: Internal Medicine

## 2017-05-21 DIAGNOSIS — E785 Hyperlipidemia, unspecified: Secondary | ICD-10-CM | POA: Diagnosis not present

## 2017-05-21 DIAGNOSIS — R7301 Impaired fasting glucose: Secondary | ICD-10-CM | POA: Diagnosis not present

## 2017-05-21 DIAGNOSIS — Z79899 Other long term (current) drug therapy: Secondary | ICD-10-CM | POA: Diagnosis not present

## 2017-05-21 DIAGNOSIS — Z125 Encounter for screening for malignant neoplasm of prostate: Secondary | ICD-10-CM | POA: Diagnosis not present

## 2017-05-28 DIAGNOSIS — M5412 Radiculopathy, cervical region: Secondary | ICD-10-CM | POA: Diagnosis not present

## 2017-05-28 DIAGNOSIS — Z0001 Encounter for general adult medical examination with abnormal findings: Secondary | ICD-10-CM | POA: Diagnosis not present

## 2017-05-28 DIAGNOSIS — K760 Fatty (change of) liver, not elsewhere classified: Secondary | ICD-10-CM | POA: Diagnosis not present

## 2017-05-28 DIAGNOSIS — L299 Pruritus, unspecified: Secondary | ICD-10-CM | POA: Diagnosis not present

## 2017-06-17 ENCOUNTER — Other Ambulatory Visit: Payer: Self-pay | Admitting: Internal Medicine

## 2017-06-17 DIAGNOSIS — F17210 Nicotine dependence, cigarettes, uncomplicated: Secondary | ICD-10-CM

## 2017-07-01 ENCOUNTER — Ambulatory Visit
Admission: RE | Admit: 2017-07-01 | Discharge: 2017-07-01 | Disposition: A | Discharge status: Home / Self Care | Payer: BLUE CROSS/BLUE SHIELD | Source: Ambulatory Visit | Attending: Internal Medicine | Admitting: Internal Medicine

## 2017-07-01 DIAGNOSIS — F17210 Nicotine dependence, cigarettes, uncomplicated: Secondary | ICD-10-CM

## 2017-07-01 DIAGNOSIS — Z87891 Personal history of nicotine dependence: Secondary | ICD-10-CM | POA: Diagnosis not present

## 2017-07-22 DIAGNOSIS — L509 Urticaria, unspecified: Secondary | ICD-10-CM | POA: Diagnosis not present

## 2017-08-24 IMAGING — DX DG CHEST 2V
2 series · 2 of 2 positions shown · non-contrast
Comparison: Prior chest x-ray 06/15/2013

CLINICAL DATA: 55-year-old male with migratory chest pain for the
past 2 weeks.

EXAM:
CHEST  2 VIEW

[chest pa]
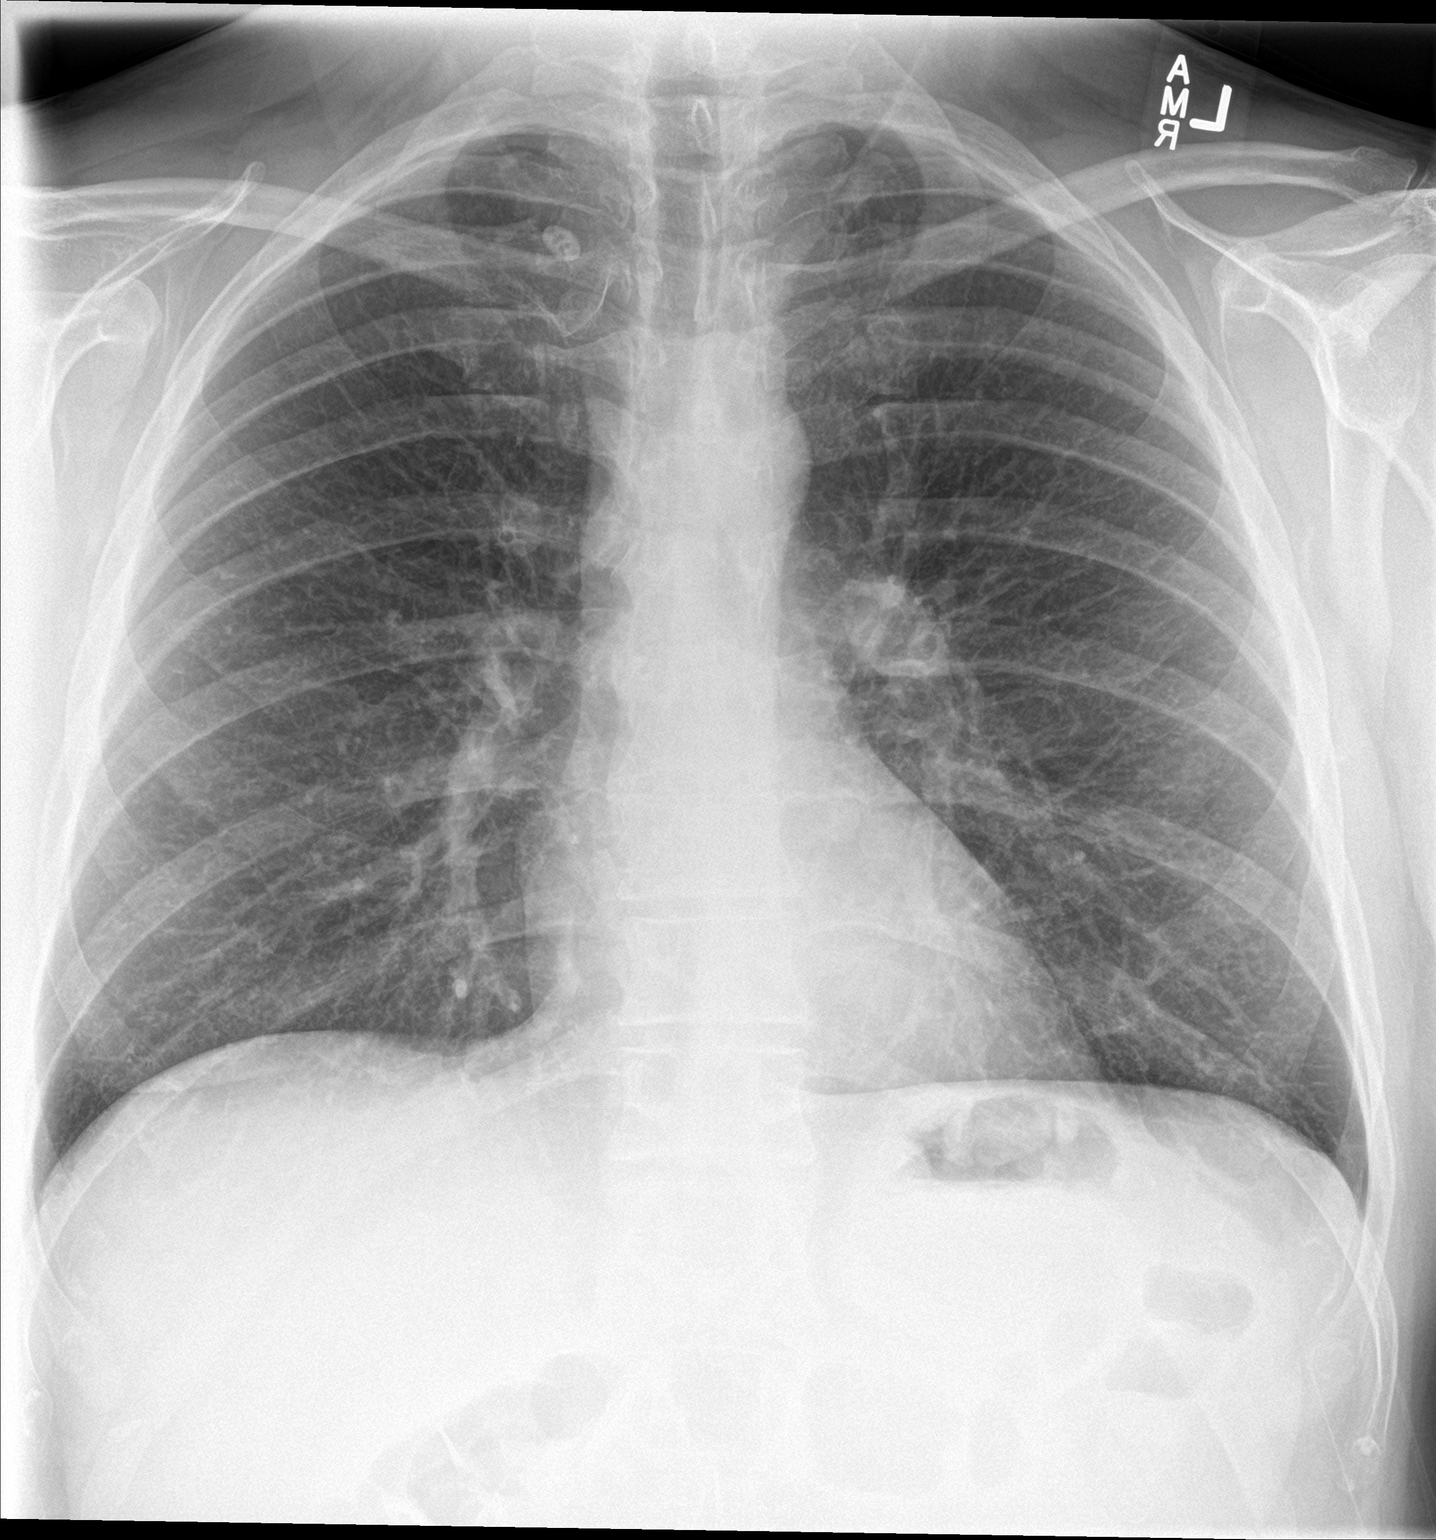

[chest lat]
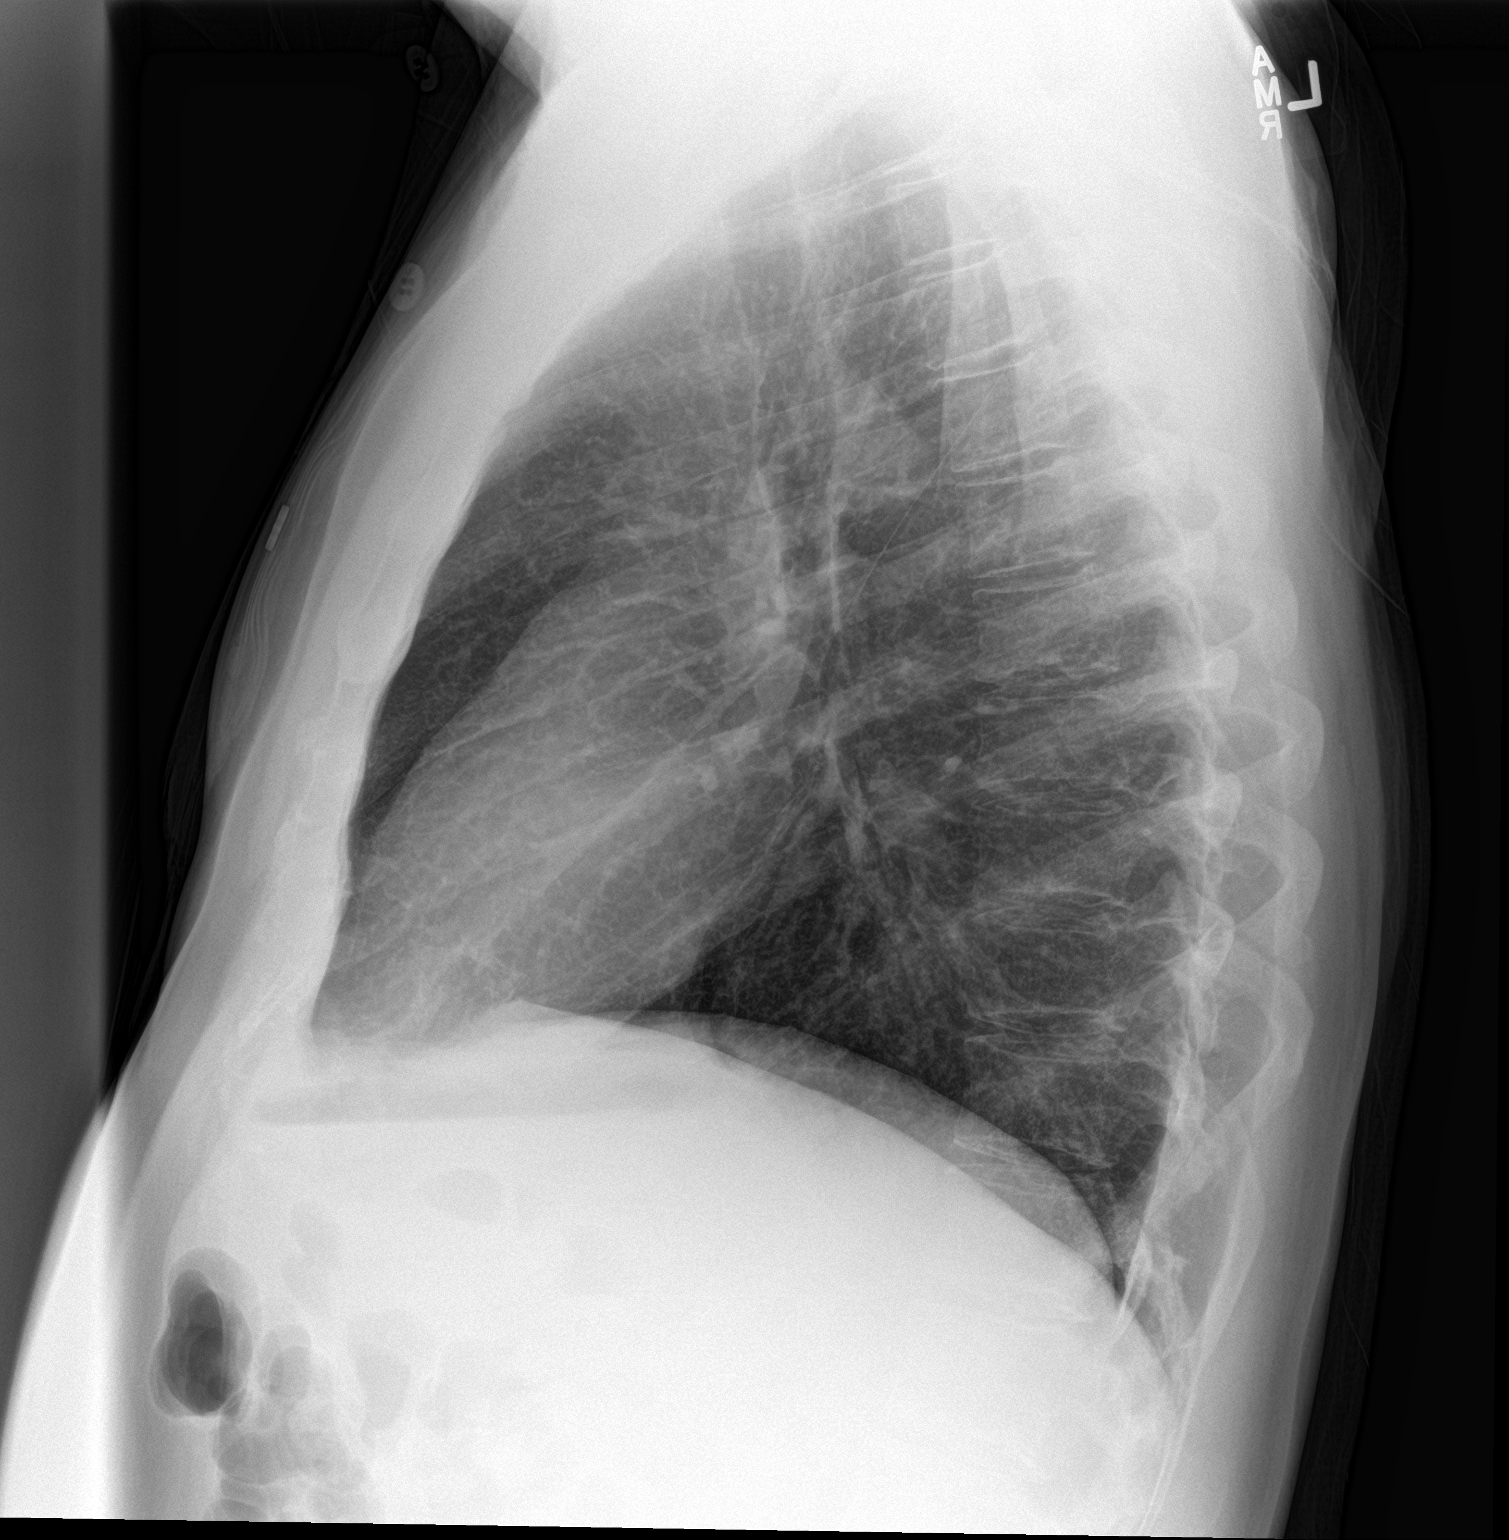

[2 of 2 positions shown; findings below may reference images not displayed]

FINDINGS: The lungs are clear and negative for focal airspace consolidation,
pulmonary edema or suspicious pulmonary nodule. No pleural effusion
or pneumothorax. Cardiac and mediastinal contours are within normal
limits. No acute fracture or lytic or blastic osseous lesions. The
visualized upper abdominal bowel gas pattern is unremarkable.
Rounded shadow with 4 central lucencies overlying the right lung
apex consistent with a button on the overlying clothing.
IMPRESSION: Negative chest x-ray.

## 2017-12-16 DIAGNOSIS — H6123 Impacted cerumen, bilateral: Secondary | ICD-10-CM | POA: Diagnosis not present

## 2017-12-31 DIAGNOSIS — L111 Transient acantholytic dermatosis [Grover]: Secondary | ICD-10-CM | POA: Diagnosis not present

## 2017-12-31 DIAGNOSIS — D225 Melanocytic nevi of trunk: Secondary | ICD-10-CM | POA: Diagnosis not present

## 2018-01-28 DIAGNOSIS — R197 Diarrhea, unspecified: Secondary | ICD-10-CM | POA: Diagnosis not present

## 2018-01-28 DIAGNOSIS — R05 Cough: Secondary | ICD-10-CM | POA: Diagnosis not present

## 2018-01-29 DIAGNOSIS — R197 Diarrhea, unspecified: Secondary | ICD-10-CM | POA: Diagnosis not present

## 2018-03-10 DIAGNOSIS — Z23 Encounter for immunization: Secondary | ICD-10-CM | POA: Diagnosis not present

## 2018-03-13 ENCOUNTER — Ambulatory Visit (HOSPITAL_COMMUNITY)
Admission: RE | Admit: 2018-03-13 | Discharge: 2018-03-13 | Disposition: A | Discharge status: Home / Self Care | Payer: BLUE CROSS/BLUE SHIELD | Source: Ambulatory Visit | Attending: Internal Medicine | Admitting: Internal Medicine

## 2018-03-13 ENCOUNTER — Other Ambulatory Visit (HOSPITAL_COMMUNITY): Payer: Self-pay | Admitting: Internal Medicine

## 2018-03-13 DIAGNOSIS — M542 Cervicalgia: Secondary | ICD-10-CM | POA: Diagnosis not present

## 2018-03-13 DIAGNOSIS — M47812 Spondylosis without myelopathy or radiculopathy, cervical region: Secondary | ICD-10-CM | POA: Diagnosis not present

## 2018-03-13 DIAGNOSIS — M5412 Radiculopathy, cervical region: Secondary | ICD-10-CM | POA: Diagnosis not present

## 2018-06-04 DIAGNOSIS — Z125 Encounter for screening for malignant neoplasm of prostate: Secondary | ICD-10-CM | POA: Diagnosis not present

## 2018-06-04 DIAGNOSIS — K760 Fatty (change of) liver, not elsewhere classified: Secondary | ICD-10-CM | POA: Diagnosis not present

## 2018-06-04 DIAGNOSIS — F17210 Nicotine dependence, cigarettes, uncomplicated: Secondary | ICD-10-CM | POA: Diagnosis not present

## 2018-06-04 DIAGNOSIS — F1020 Alcohol dependence, uncomplicated: Secondary | ICD-10-CM | POA: Diagnosis not present

## 2018-06-10 ENCOUNTER — Other Ambulatory Visit: Payer: Self-pay | Admitting: Internal Medicine

## 2018-06-10 ENCOUNTER — Other Ambulatory Visit (HOSPITAL_COMMUNITY): Payer: Self-pay | Admitting: Internal Medicine

## 2018-06-10 ENCOUNTER — Other Ambulatory Visit: Payer: Self-pay

## 2018-06-10 DIAGNOSIS — Z0001 Encounter for general adult medical examination with abnormal findings: Secondary | ICD-10-CM | POA: Diagnosis not present

## 2018-06-10 DIAGNOSIS — M5412 Radiculopathy, cervical region: Secondary | ICD-10-CM

## 2018-06-10 DIAGNOSIS — Z6825 Body mass index (BMI) 25.0-25.9, adult: Secondary | ICD-10-CM | POA: Diagnosis not present

## 2018-06-10 DIAGNOSIS — K760 Fatty (change of) liver, not elsewhere classified: Secondary | ICD-10-CM | POA: Diagnosis not present

## 2018-06-17 ENCOUNTER — Ambulatory Visit (HOSPITAL_COMMUNITY)
Admission: RE | Admit: 2018-06-17 | Discharge: 2018-06-17 | Disposition: A | Discharge status: Home / Self Care | Payer: BLUE CROSS/BLUE SHIELD | Source: Ambulatory Visit | Attending: Internal Medicine | Admitting: Internal Medicine

## 2018-06-17 DIAGNOSIS — M5412 Radiculopathy, cervical region: Secondary | ICD-10-CM

## 2018-06-17 DIAGNOSIS — M542 Cervicalgia: Secondary | ICD-10-CM | POA: Diagnosis not present

## 2018-06-20 DIAGNOSIS — M5412 Radiculopathy, cervical region: Secondary | ICD-10-CM | POA: Diagnosis not present

## 2018-06-24 ENCOUNTER — Other Ambulatory Visit: Payer: Self-pay | Admitting: Neurological Surgery

## 2018-06-24 DIAGNOSIS — M5412 Radiculopathy, cervical region: Secondary | ICD-10-CM

## 2018-07-02 ENCOUNTER — Ambulatory Visit
Admission: RE | Admit: 2018-07-02 | Discharge: 2018-07-02 | Disposition: A | Discharge status: Home / Self Care | Payer: BLUE CROSS/BLUE SHIELD | Source: Ambulatory Visit | Attending: Neurological Surgery | Admitting: Neurological Surgery

## 2018-07-02 DIAGNOSIS — M4723 Other spondylosis with radiculopathy, cervicothoracic region: Secondary | ICD-10-CM | POA: Diagnosis not present

## 2018-07-02 DIAGNOSIS — M5412 Radiculopathy, cervical region: Secondary | ICD-10-CM

## 2018-07-02 MED ORDER — IOPAMIDOL (ISOVUE-M 300) INJECTION 61%
1.0000 mL | Freq: Once | INTRAMUSCULAR | Status: AC | PRN
  Administered 2018-07-02: 1 mL via EPIDURAL

## 2018-07-02 MED ORDER — METHYLPREDNISOLONE ACETATE 40 MG/ML INJ SUSP (RADIOLOG
120.0000 mg | Freq: Once | INTRAMUSCULAR | Status: AC
  Administered 2018-07-02: 120 mg via EPIDURAL

## 2018-07-02 NOTE — Discharge Instructions (Signed)

## 2018-08-13 DIAGNOSIS — M5412 Radiculopathy, cervical region: Secondary | ICD-10-CM | POA: Diagnosis not present

## 2018-12-04 DIAGNOSIS — Z20828 Contact with and (suspected) exposure to other viral communicable diseases: Secondary | ICD-10-CM | POA: Diagnosis not present

## 2018-12-05 ENCOUNTER — Other Ambulatory Visit: Payer: Self-pay

## 2018-12-05 ENCOUNTER — Other Ambulatory Visit: Payer: BLUE CROSS/BLUE SHIELD

## 2018-12-05 DIAGNOSIS — Z20822 Contact with and (suspected) exposure to covid-19: Secondary | ICD-10-CM

## 2018-12-05 DIAGNOSIS — R6889 Other general symptoms and signs: Secondary | ICD-10-CM | POA: Diagnosis not present

## 2018-12-06 LAB — NOVEL CORONAVIRUS, NAA: SARS-CoV-2, NAA: NOT DETECTED

## 2019-02-19 DIAGNOSIS — Z23 Encounter for immunization: Secondary | ICD-10-CM | POA: Diagnosis not present

## 2019-06-10 DIAGNOSIS — Z0001 Encounter for general adult medical examination with abnormal findings: Secondary | ICD-10-CM | POA: Diagnosis not present

## 2019-06-10 DIAGNOSIS — Z125 Encounter for screening for malignant neoplasm of prostate: Secondary | ICD-10-CM | POA: Diagnosis not present

## 2019-06-18 ENCOUNTER — Other Ambulatory Visit (HOSPITAL_COMMUNITY): Payer: Self-pay | Admitting: Internal Medicine

## 2019-06-18 ENCOUNTER — Other Ambulatory Visit: Payer: Self-pay | Admitting: Internal Medicine

## 2019-06-18 DIAGNOSIS — K760 Fatty (change of) liver, not elsewhere classified: Secondary | ICD-10-CM | POA: Diagnosis not present

## 2019-06-18 DIAGNOSIS — F17210 Nicotine dependence, cigarettes, uncomplicated: Secondary | ICD-10-CM | POA: Diagnosis not present

## 2019-06-18 DIAGNOSIS — M5090 Cervical disc disorder, unspecified, unspecified cervical region: Secondary | ICD-10-CM | POA: Diagnosis not present

## 2019-07-02 ENCOUNTER — Other Ambulatory Visit: Payer: Self-pay

## 2019-07-02 ENCOUNTER — Ambulatory Visit (HOSPITAL_COMMUNITY)
Admission: RE | Admit: 2019-07-02 | Discharge: 2019-07-02 | Disposition: A | Discharge status: Home / Self Care | Payer: BC Managed Care – PPO | Source: Ambulatory Visit | Attending: Internal Medicine | Admitting: Internal Medicine

## 2019-07-02 DIAGNOSIS — Z87891 Personal history of nicotine dependence: Secondary | ICD-10-CM | POA: Diagnosis not present

## 2019-07-02 DIAGNOSIS — F17210 Nicotine dependence, cigarettes, uncomplicated: Secondary | ICD-10-CM | POA: Diagnosis not present

## 2019-07-16 DIAGNOSIS — Z23 Encounter for immunization: Secondary | ICD-10-CM | POA: Diagnosis not present

## 2019-08-15 DIAGNOSIS — Z23 Encounter for immunization: Secondary | ICD-10-CM | POA: Diagnosis not present

## 2019-12-01 DIAGNOSIS — R079 Chest pain, unspecified: Secondary | ICD-10-CM | POA: Diagnosis not present

## 2020-01-14 DIAGNOSIS — Z20822 Contact with and (suspected) exposure to covid-19: Secondary | ICD-10-CM | POA: Diagnosis not present

## 2020-05-10 DIAGNOSIS — Z23 Encounter for immunization: Secondary | ICD-10-CM | POA: Diagnosis not present

## 2020-06-09 DIAGNOSIS — Z0001 Encounter for general adult medical examination with abnormal findings: Secondary | ICD-10-CM | POA: Diagnosis not present

## 2020-06-09 DIAGNOSIS — Z125 Encounter for screening for malignant neoplasm of prostate: Secondary | ICD-10-CM | POA: Diagnosis not present

## 2020-06-21 DIAGNOSIS — Z0000 Encounter for general adult medical examination without abnormal findings: Secondary | ICD-10-CM | POA: Diagnosis not present

## 2020-06-21 DIAGNOSIS — M5090 Cervical disc disorder, unspecified, unspecified cervical region: Secondary | ICD-10-CM | POA: Diagnosis not present

## 2020-06-21 DIAGNOSIS — J439 Emphysema, unspecified: Secondary | ICD-10-CM | POA: Diagnosis not present

## 2020-06-21 DIAGNOSIS — Z6841 Body Mass Index (BMI) 40.0 and over, adult: Secondary | ICD-10-CM | POA: Diagnosis not present

## 2020-06-21 DIAGNOSIS — Z720 Tobacco use: Secondary | ICD-10-CM | POA: Diagnosis not present

## 2020-08-01 ENCOUNTER — Other Ambulatory Visit (HOSPITAL_COMMUNITY): Payer: Self-pay | Admitting: Internal Medicine

## 2020-08-01 DIAGNOSIS — Z720 Tobacco use: Secondary | ICD-10-CM

## 2020-08-26 ENCOUNTER — Other Ambulatory Visit: Payer: Self-pay

## 2020-08-26 ENCOUNTER — Ambulatory Visit (HOSPITAL_COMMUNITY)
Admission: RE | Admit: 2020-08-26 | Discharge: 2020-08-26 | Disposition: A | Discharge status: Home / Self Care | Payer: BC Managed Care – PPO | Source: Ambulatory Visit | Attending: Internal Medicine | Admitting: Internal Medicine

## 2020-08-26 DIAGNOSIS — Z720 Tobacco use: Secondary | ICD-10-CM | POA: Diagnosis not present

## 2020-08-26 DIAGNOSIS — F17210 Nicotine dependence, cigarettes, uncomplicated: Secondary | ICD-10-CM | POA: Diagnosis not present

## 2020-11-10 DIAGNOSIS — R051 Acute cough: Secondary | ICD-10-CM | POA: Diagnosis not present

## 2020-11-10 DIAGNOSIS — J439 Emphysema, unspecified: Secondary | ICD-10-CM | POA: Diagnosis not present

## 2020-12-18 ENCOUNTER — Telehealth: Payer: Self-pay | Admitting: Emergency Medicine

## 2020-12-18 ENCOUNTER — Ambulatory Visit
Admission: EM | Admit: 2020-12-18 | Discharge: 2020-12-18 | Disposition: A | Discharge status: Home / Self Care | Payer: BC Managed Care – PPO | Attending: Family Medicine | Admitting: Family Medicine

## 2020-12-18 ENCOUNTER — Encounter: Payer: Self-pay | Admitting: Emergency Medicine

## 2020-12-18 DIAGNOSIS — S0502XA Injury of conjunctiva and corneal abrasion without foreign body, left eye, initial encounter: Secondary | ICD-10-CM

## 2020-12-18 DIAGNOSIS — H10502 Unspecified blepharoconjunctivitis, left eye: Secondary | ICD-10-CM | POA: Diagnosis not present

## 2020-12-18 MED ORDER — SULFAMETHOXAZOLE-TRIMETHOPRIM 800-160 MG PO TABS: 1.0000 | ORAL_TABLET | Freq: Two times a day (BID) | ORAL | 0 refills | Status: AC

## 2020-12-18 MED ORDER — SULFAMETHOXAZOLE-TRIMETHOPRIM 800-160 MG PO TABS: 1.0000 | ORAL_TABLET | Freq: Two times a day (BID) | ORAL | 0 refills | Status: DC

## 2020-12-18 MED ORDER — TOBRAMYCIN 0.3 % OP OINT: TOPICAL_OINTMENT | Freq: Once | OPHTHALMIC | Status: AC

## 2020-12-18 NOTE — ED Provider Notes (Signed)
[facility]    CSN: [id number] Arrival date & time: 12/18/20  1200      History   Chief Complaint Chief Complaint  Patient presents with   Eyelid Problem    HPI Luis Pruitt is a 60 y.o. male.   HPI Patient presents today with left eyelid swelling.  Patient reports while playing golf 5 days ago he had a sensation of a foreign body in his eye.  He figured it may be dust or dirt from the golf course.  He did not immediately rinse his eye out but endorses rubbing his eye.  Upon awakening this morning he had purulent drainage along with tenderness and redness to the upper left eyelid.  Inner canthus of the upper eyelid has prominent swelling and is most tender.  He denies any difficulty seeing. History reviewed. No pertinent past medical history.  Patient Active Problem List   Diagnosis Date Noted   Family hx of colon cancer 04/03/2017    Past Surgical History:  Procedure Laterality Date   APPENDECTOMY     COLONOSCOPY  02/28/2012   Procedure: COLONOSCOPY;  Surgeon: Rogene Houston, MD;  Location: [facility];  Service: Endoscopy;  Laterality: N/A;  830   COLONOSCOPY N/A 05/03/2017   Procedure: COLONOSCOPY;  Surgeon: Rogene Houston, MD;  Location: [facility];  Service: Endoscopy;  Laterality: N/A;  200   EXPLORATORY LAPAROTOMY     with appendectomy       Home Medications    Prior to Admission medications   Medication Sig Start Date End Date Taking? Authorizing Provider  sulfamethoxazole-trimethoprim (BACTRIM DS) 800-160 MG tablet Take 1 tablet by mouth 2 (two) times daily for 7 days. 12/18/20 12/25/20  Scot Jun, FNP    Family History Family History  Problem Relation Age of Onset   Colon cancer Father    Melanoma Father    COPD Mother    Multiple sclerosis Sister    Brain cancer Brother     Social History Social History   Tobacco Use   Smoking status: Every Day    Packs/day: 1.00    Types: Cigarettes   Smokeless tobacco:  Never  Vaping Use   Vaping Use: Never used  Substance Use Topics   Alcohol use: Yes    Comment: beer and wine daily   Drug use: No     Allergies   Patient has no known allergies.   Review of Systems Review of Systems Pertinent negatives listed in HPI   Physical Exam Triage Vital Signs ED Triage Vitals [12/18/20 1444]  Enc Vitals Group     BP (!) 141/78     Pulse Rate (!) 55     Resp 18     Temp 98.3 F (36.8 C)     Temp Source Oral     SpO2 98 %     Weight      Height      Head Circumference      Peak Flow      Pain Score 3     Pain Loc      Pain Edu?      Excl. in [place]?    No data found.  Updated Vital Signs BP (!) 141/78 (BP Location: Right Arm)   Pulse (!) 55   Temp 98.3 F (36.8 C) (Oral)   Resp 18   SpO2 98%   Visual Acuity Right Eye Distance:   Left Eye Distance:   Bilateral Distance:  Right Eye Near:   Left Eye Near:    Bilateral Near:     Physical Exam Constitutional:      Appearance: Normal appearance.  HENT:     Head: Normocephalic and atraumatic.     Nose: Nose normal.  Eyes:     General: Lids are normal. Lids are everted, no foreign bodies appreciated. Vision grossly intact.        Right eye: No discharge.        Left eye: No discharge.     Extraocular Movements:     Left eye: Normal extraocular motion.     Conjunctiva/sclera:     Left eye: Left conjunctiva is injected. Hemorrhage present.     Pupils:     Left eye: Corneal abrasion and fluorescein uptake present.  Cardiovascular:     Rate and Rhythm: Normal rate and regular rhythm.  Pulmonary:     Effort: Pulmonary effort is normal.     Breath sounds: Normal breath sounds and air entry.  Skin:    General: Skin is warm and dry.     Capillary Refill: Capillary refill takes less than 2 seconds.  Neurological:     General: No focal deficit present.     Mental Status: He is alert and oriented to person, place, and time.  Psychiatric:        Attention and Perception:  Attention normal.        Mood and Affect: Mood normal.        Speech: Speech normal.     UC Treatments / Results  Labs (all labs ordered are listed, but only abnormal results are displayed) Labs Reviewed - No data to display  EKG   Radiology No results found.  Procedures Procedures (including critical care time)  Medications Ordered in UC Medications  tobramycin (TOBREX) 0.3 % ophthalmic ointment ( Left Eye Given by Other 12/18/20 1502)    Initial Impression / Assessment and Plan / UC Course  I have reviewed the triage vital signs and the nursing notes.  Pertinent labs & imaging results that were available during my care of the patient were reviewed by me and considered in my medical decision making (see chart for details).    Blepharoconjunctivitis and left corneal abrasion . Strict ophthalmology follow-up indicated if symptoms do not readily improve Pt verbalized understanding and agreement of plan.  Final Clinical Impressions(s) / UC Diagnoses   Final diagnoses:  Blepharoconjunctivitis of left eye, unspecified blepharoconjunctivitis type  Left corneal abrasion, initial encounter     Discharge Instructions      Apply tobramycin to the lower left eyelid twice daily for total 5 days. Start Bactrim 1 tablet twice daily for total of 7 days. Follow-up with eye doctor if symptoms worsen or do not improve.     ED Prescriptions     Medication Sig Dispense Auth. Provider   sulfamethoxazole-trimethoprim (BACTRIM DS) 800-160 MG tablet  (Status: Discontinued) Take 1 tablet by mouth 2 (two) times daily. 10 tablet Scot Jun, FNP   sulfamethoxazole-trimethoprim (BACTRIM DS) 800-160 MG tablet  (Status: Discontinued) Take 1 tablet by mouth 2 (two) times daily. 10 tablet Scot Jun, FNP   sulfamethoxazole-trimethoprim (BACTRIM DS) 800-160 MG tablet Take 1 tablet by mouth 2 (two) times daily for 7 days. 14 tablet Scot Jun, FNP      PDMP not  reviewed this encounter.   Scot Jun, FNP 12/23/20 1418

## 2020-12-18 NOTE — ED Triage Notes (Signed)
Swelling to LT eyelid that started today

## 2020-12-18 NOTE — Discharge Instructions (Addendum)
Apply tobramycin to the lower left eyelid twice daily for total 5 days. Start Bactrim 1 tablet twice daily for total of 7 days. Follow-up with eye doctor if symptoms worsen or do not improve.

## 2020-12-19 DIAGNOSIS — L570 Actinic keratosis: Secondary | ICD-10-CM | POA: Diagnosis not present

## 2020-12-19 DIAGNOSIS — L718 Other rosacea: Secondary | ICD-10-CM | POA: Diagnosis not present

## 2020-12-19 DIAGNOSIS — D485 Neoplasm of uncertain behavior of skin: Secondary | ICD-10-CM | POA: Diagnosis not present

## 2020-12-19 DIAGNOSIS — D225 Melanocytic nevi of trunk: Secondary | ICD-10-CM | POA: Diagnosis not present

## 2020-12-19 DIAGNOSIS — L821 Other seborrheic keratosis: Secondary | ICD-10-CM | POA: Diagnosis not present

## 2020-12-19 DIAGNOSIS — D692 Other nonthrombocytopenic purpura: Secondary | ICD-10-CM | POA: Diagnosis not present

## 2020-12-21 DIAGNOSIS — H1032 Unspecified acute conjunctivitis, left eye: Secondary | ICD-10-CM | POA: Diagnosis not present

## 2021-03-06 DIAGNOSIS — Z23 Encounter for immunization: Secondary | ICD-10-CM | POA: Diagnosis not present

## 2021-04-03 DIAGNOSIS — D485 Neoplasm of uncertain behavior of skin: Secondary | ICD-10-CM | POA: Diagnosis not present

## 2021-04-03 DIAGNOSIS — L988 Other specified disorders of the skin and subcutaneous tissue: Secondary | ICD-10-CM | POA: Diagnosis not present

## 2021-04-05 DIAGNOSIS — H5213 Myopia, bilateral: Secondary | ICD-10-CM | POA: Diagnosis not present

## 2021-05-15 DIAGNOSIS — H6121 Impacted cerumen, right ear: Secondary | ICD-10-CM | POA: Diagnosis not present

## 2021-05-26 DIAGNOSIS — J060 Acute laryngopharyngitis: Secondary | ICD-10-CM | POA: Diagnosis not present

## 2021-05-26 DIAGNOSIS — M5416 Radiculopathy, lumbar region: Secondary | ICD-10-CM | POA: Diagnosis not present

## 2021-06-20 DIAGNOSIS — J439 Emphysema, unspecified: Secondary | ICD-10-CM | POA: Diagnosis not present

## 2021-06-20 DIAGNOSIS — Z125 Encounter for screening for malignant neoplasm of prostate: Secondary | ICD-10-CM | POA: Diagnosis not present

## 2021-06-20 DIAGNOSIS — K760 Fatty (change of) liver, not elsewhere classified: Secondary | ICD-10-CM | POA: Diagnosis not present

## 2021-06-20 DIAGNOSIS — F17210 Nicotine dependence, cigarettes, uncomplicated: Secondary | ICD-10-CM | POA: Diagnosis not present

## 2021-06-26 DIAGNOSIS — K760 Fatty (change of) liver, not elsewhere classified: Secondary | ICD-10-CM | POA: Diagnosis not present

## 2021-06-26 DIAGNOSIS — Z6825 Body mass index (BMI) 25.0-25.9, adult: Secondary | ICD-10-CM | POA: Diagnosis not present

## 2021-06-26 DIAGNOSIS — M5416 Radiculopathy, lumbar region: Secondary | ICD-10-CM | POA: Diagnosis not present

## 2021-06-26 DIAGNOSIS — Z0000 Encounter for general adult medical examination without abnormal findings: Secondary | ICD-10-CM | POA: Diagnosis not present

## 2021-06-26 DIAGNOSIS — J439 Emphysema, unspecified: Secondary | ICD-10-CM | POA: Diagnosis not present

## 2021-07-14 DIAGNOSIS — M5416 Radiculopathy, lumbar region: Secondary | ICD-10-CM | POA: Diagnosis not present

## 2021-07-14 DIAGNOSIS — Z6826 Body mass index (BMI) 26.0-26.9, adult: Secondary | ICD-10-CM | POA: Diagnosis not present

## 2021-07-14 DIAGNOSIS — M5412 Radiculopathy, cervical region: Secondary | ICD-10-CM | POA: Diagnosis not present

## 2021-07-20 DIAGNOSIS — M545 Low back pain, unspecified: Secondary | ICD-10-CM | POA: Diagnosis not present

## 2021-07-20 DIAGNOSIS — M5416 Radiculopathy, lumbar region: Secondary | ICD-10-CM | POA: Diagnosis not present

## 2021-07-20 DIAGNOSIS — M47816 Spondylosis without myelopathy or radiculopathy, lumbar region: Secondary | ICD-10-CM | POA: Diagnosis not present

## 2021-07-26 DIAGNOSIS — M5416 Radiculopathy, lumbar region: Secondary | ICD-10-CM | POA: Diagnosis not present

## 2021-07-26 DIAGNOSIS — M5412 Radiculopathy, cervical region: Secondary | ICD-10-CM | POA: Diagnosis not present

## 2021-07-26 DIAGNOSIS — R030 Elevated blood-pressure reading, without diagnosis of hypertension: Secondary | ICD-10-CM | POA: Diagnosis not present

## 2021-07-26 DIAGNOSIS — Z6826 Body mass index (BMI) 26.0-26.9, adult: Secondary | ICD-10-CM | POA: Diagnosis not present

## 2021-08-08 DIAGNOSIS — M5412 Radiculopathy, cervical region: Secondary | ICD-10-CM | POA: Diagnosis not present

## 2021-08-08 DIAGNOSIS — M5416 Radiculopathy, lumbar region: Secondary | ICD-10-CM | POA: Diagnosis not present

## 2021-09-26 ENCOUNTER — Other Ambulatory Visit: Payer: Self-pay | Admitting: Internal Medicine

## 2021-09-26 ENCOUNTER — Other Ambulatory Visit (HOSPITAL_COMMUNITY): Payer: Self-pay | Admitting: Internal Medicine

## 2021-09-26 DIAGNOSIS — F17200 Nicotine dependence, unspecified, uncomplicated: Secondary | ICD-10-CM

## 2021-11-01 ENCOUNTER — Ambulatory Visit (HOSPITAL_COMMUNITY): Payer: BC Managed Care – PPO

## 2021-11-01 ENCOUNTER — Encounter (HOSPITAL_COMMUNITY): Payer: Self-pay

## 2021-12-20 DIAGNOSIS — L570 Actinic keratosis: Secondary | ICD-10-CM | POA: Diagnosis not present

## 2021-12-20 DIAGNOSIS — L821 Other seborrheic keratosis: Secondary | ICD-10-CM | POA: Diagnosis not present

## 2021-12-20 DIAGNOSIS — D225 Melanocytic nevi of trunk: Secondary | ICD-10-CM | POA: Diagnosis not present

## 2021-12-20 DIAGNOSIS — D2239 Melanocytic nevi of other parts of face: Secondary | ICD-10-CM | POA: Diagnosis not present

## 2021-12-20 DIAGNOSIS — D692 Other nonthrombocytopenic purpura: Secondary | ICD-10-CM | POA: Diagnosis not present

## 2022-03-22 DIAGNOSIS — M5412 Radiculopathy, cervical region: Secondary | ICD-10-CM | POA: Diagnosis not present

## 2022-03-23 ENCOUNTER — Other Ambulatory Visit (HOSPITAL_COMMUNITY): Payer: Self-pay | Admitting: Internal Medicine

## 2022-03-23 DIAGNOSIS — E785 Hyperlipidemia, unspecified: Secondary | ICD-10-CM

## 2022-04-18 DIAGNOSIS — Z6826 Body mass index (BMI) 26.0-26.9, adult: Secondary | ICD-10-CM | POA: Diagnosis not present

## 2022-04-18 DIAGNOSIS — M5412 Radiculopathy, cervical region: Secondary | ICD-10-CM | POA: Diagnosis not present

## 2022-04-19 ENCOUNTER — Ambulatory Visit (HOSPITAL_COMMUNITY)
Admission: RE | Admit: 2022-04-19 | Discharge: 2022-04-19 | Disposition: A | Discharge status: Home / Self Care | Payer: BC Managed Care – PPO | Source: Ambulatory Visit | Attending: Internal Medicine | Admitting: Internal Medicine

## 2022-04-19 DIAGNOSIS — F17210 Nicotine dependence, cigarettes, uncomplicated: Secondary | ICD-10-CM | POA: Diagnosis not present

## 2022-04-19 DIAGNOSIS — E785 Hyperlipidemia, unspecified: Secondary | ICD-10-CM | POA: Insufficient documentation

## 2022-04-19 DIAGNOSIS — F17200 Nicotine dependence, unspecified, uncomplicated: Secondary | ICD-10-CM

## 2022-04-20 ENCOUNTER — Ambulatory Visit (HOSPITAL_COMMUNITY): Payer: BC Managed Care – PPO

## 2022-04-25 ENCOUNTER — Encounter (INDEPENDENT_AMBULATORY_CARE_PROVIDER_SITE_OTHER): Payer: Self-pay | Admitting: *Deleted

## 2022-05-03 ENCOUNTER — Other Ambulatory Visit (HOSPITAL_COMMUNITY): Payer: BC Managed Care – PPO

## 2022-05-09 DIAGNOSIS — M5412 Radiculopathy, cervical region: Secondary | ICD-10-CM | POA: Diagnosis not present

## 2022-05-09 DIAGNOSIS — R202 Paresthesia of skin: Secondary | ICD-10-CM | POA: Diagnosis not present

## 2022-05-09 DIAGNOSIS — M4802 Spinal stenosis, cervical region: Secondary | ICD-10-CM | POA: Diagnosis not present

## 2022-05-11 DIAGNOSIS — M50122 Cervical disc disorder at C5-C6 level with radiculopathy: Secondary | ICD-10-CM | POA: Diagnosis not present

## 2022-05-11 DIAGNOSIS — Z6826 Body mass index (BMI) 26.0-26.9, adult: Secondary | ICD-10-CM | POA: Diagnosis not present

## 2022-05-11 DIAGNOSIS — M50123 Cervical disc disorder at C6-C7 level with radiculopathy: Secondary | ICD-10-CM | POA: Diagnosis not present

## 2022-05-30 DIAGNOSIS — E785 Hyperlipidemia, unspecified: Secondary | ICD-10-CM | POA: Diagnosis not present

## 2022-05-30 DIAGNOSIS — Z79899 Other long term (current) drug therapy: Secondary | ICD-10-CM | POA: Diagnosis not present

## 2022-06-04 DIAGNOSIS — E785 Hyperlipidemia, unspecified: Secondary | ICD-10-CM | POA: Diagnosis not present

## 2022-06-04 DIAGNOSIS — R931 Abnormal findings on diagnostic imaging of heart and coronary circulation: Secondary | ICD-10-CM | POA: Diagnosis not present

## 2022-06-25 DIAGNOSIS — R7301 Impaired fasting glucose: Secondary | ICD-10-CM | POA: Diagnosis not present

## 2022-06-25 DIAGNOSIS — J439 Emphysema, unspecified: Secondary | ICD-10-CM | POA: Diagnosis not present

## 2022-06-25 DIAGNOSIS — Z79899 Other long term (current) drug therapy: Secondary | ICD-10-CM | POA: Diagnosis not present

## 2022-06-25 DIAGNOSIS — Z125 Encounter for screening for malignant neoplasm of prostate: Secondary | ICD-10-CM | POA: Diagnosis not present

## 2022-06-25 DIAGNOSIS — K760 Fatty (change of) liver, not elsewhere classified: Secondary | ICD-10-CM | POA: Diagnosis not present

## 2022-07-02 DIAGNOSIS — J439 Emphysema, unspecified: Secondary | ICD-10-CM | POA: Diagnosis not present

## 2022-07-02 DIAGNOSIS — I700 Atherosclerosis of aorta: Secondary | ICD-10-CM | POA: Diagnosis not present

## 2022-07-02 DIAGNOSIS — J42 Unspecified chronic bronchitis: Secondary | ICD-10-CM | POA: Diagnosis not present

## 2022-07-02 DIAGNOSIS — R7309 Other abnormal glucose: Secondary | ICD-10-CM | POA: Diagnosis not present

## 2022-07-10 DIAGNOSIS — M50122 Cervical disc disorder at C5-C6 level with radiculopathy: Secondary | ICD-10-CM | POA: Diagnosis not present

## 2022-12-06 DIAGNOSIS — M545 Low back pain, unspecified: Secondary | ICD-10-CM | POA: Diagnosis not present

## 2022-12-06 DIAGNOSIS — M25511 Pain in right shoulder: Secondary | ICD-10-CM | POA: Diagnosis not present

## 2022-12-17 DIAGNOSIS — H00014 Hordeolum externum left upper eyelid: Secondary | ICD-10-CM | POA: Diagnosis not present

## 2022-12-25 DIAGNOSIS — D225 Melanocytic nevi of trunk: Secondary | ICD-10-CM | POA: Diagnosis not present

## 2022-12-25 DIAGNOSIS — D2261 Melanocytic nevi of right upper limb, including shoulder: Secondary | ICD-10-CM | POA: Diagnosis not present

## 2022-12-25 DIAGNOSIS — L821 Other seborrheic keratosis: Secondary | ICD-10-CM | POA: Diagnosis not present

## 2022-12-25 DIAGNOSIS — L720 Epidermal cyst: Secondary | ICD-10-CM | POA: Diagnosis not present

## 2023-01-02 DIAGNOSIS — M25511 Pain in right shoulder: Secondary | ICD-10-CM | POA: Diagnosis not present

## 2023-01-29 DIAGNOSIS — H2513 Age-related nuclear cataract, bilateral: Secondary | ICD-10-CM | POA: Diagnosis not present

## 2023-06-21 DIAGNOSIS — K760 Fatty (change of) liver, not elsewhere classified: Secondary | ICD-10-CM | POA: Diagnosis not present

## 2023-06-21 DIAGNOSIS — J439 Emphysema, unspecified: Secondary | ICD-10-CM | POA: Diagnosis not present

## 2023-06-21 DIAGNOSIS — Z79899 Other long term (current) drug therapy: Secondary | ICD-10-CM | POA: Diagnosis not present

## 2023-06-21 DIAGNOSIS — Z125 Encounter for screening for malignant neoplasm of prostate: Secondary | ICD-10-CM | POA: Diagnosis not present

## 2023-06-21 DIAGNOSIS — I700 Atherosclerosis of aorta: Secondary | ICD-10-CM | POA: Diagnosis not present

## 2023-07-05 DIAGNOSIS — Z23 Encounter for immunization: Secondary | ICD-10-CM | POA: Diagnosis not present

## 2023-07-05 DIAGNOSIS — I700 Atherosclerosis of aorta: Secondary | ICD-10-CM | POA: Diagnosis not present

## 2023-07-05 DIAGNOSIS — R7301 Impaired fasting glucose: Secondary | ICD-10-CM | POA: Diagnosis not present

## 2023-07-05 DIAGNOSIS — Z0001 Encounter for general adult medical examination with abnormal findings: Secondary | ICD-10-CM | POA: Diagnosis not present

## 2023-07-05 DIAGNOSIS — J439 Emphysema, unspecified: Secondary | ICD-10-CM | POA: Diagnosis not present

## 2023-07-09 ENCOUNTER — Other Ambulatory Visit (HOSPITAL_COMMUNITY): Payer: Self-pay | Admitting: Internal Medicine

## 2023-07-09 DIAGNOSIS — Z122 Encounter for screening for malignant neoplasm of respiratory organs: Secondary | ICD-10-CM

## 2023-07-09 DIAGNOSIS — F17200 Nicotine dependence, unspecified, uncomplicated: Secondary | ICD-10-CM

## 2023-08-09 DIAGNOSIS — Z23 Encounter for immunization: Secondary | ICD-10-CM | POA: Diagnosis not present

## 2023-08-09 DIAGNOSIS — H6123 Impacted cerumen, bilateral: Secondary | ICD-10-CM | POA: Diagnosis not present

## 2023-08-29 ENCOUNTER — Ambulatory Visit (HOSPITAL_COMMUNITY)

## 2023-08-30 DIAGNOSIS — Z860100 Personal history of colon polyps, unspecified: Secondary | ICD-10-CM | POA: Diagnosis not present

## 2023-08-30 DIAGNOSIS — D123 Benign neoplasm of transverse colon: Secondary | ICD-10-CM | POA: Diagnosis not present

## 2023-08-30 DIAGNOSIS — Z860101 Personal history of adenomatous and serrated colon polyps: Secondary | ICD-10-CM | POA: Diagnosis not present

## 2023-08-30 DIAGNOSIS — Z1211 Encounter for screening for malignant neoplasm of colon: Secondary | ICD-10-CM | POA: Diagnosis not present

## 2023-09-02 DIAGNOSIS — M5136 Other intervertebral disc degeneration, lumbar region with discogenic back pain only: Secondary | ICD-10-CM | POA: Diagnosis not present

## 2023-09-02 DIAGNOSIS — R310 Gross hematuria: Secondary | ICD-10-CM | POA: Diagnosis not present

## 2023-09-03 ENCOUNTER — Other Ambulatory Visit (HOSPITAL_COMMUNITY): Payer: Self-pay | Admitting: Internal Medicine

## 2023-09-03 DIAGNOSIS — R310 Gross hematuria: Secondary | ICD-10-CM

## 2023-09-06 ENCOUNTER — Ambulatory Visit (HOSPITAL_BASED_OUTPATIENT_CLINIC_OR_DEPARTMENT_OTHER)
Admission: RE | Admit: 2023-09-06 | Discharge: 2023-09-06 | Disposition: A | Discharge status: Home / Self Care | Source: Ambulatory Visit | Attending: Internal Medicine | Admitting: Internal Medicine

## 2023-09-06 DIAGNOSIS — N1330 Unspecified hydronephrosis: Secondary | ICD-10-CM | POA: Diagnosis not present

## 2023-09-06 DIAGNOSIS — R1900 Intra-abdominal and pelvic swelling, mass and lump, unspecified site: Secondary | ICD-10-CM | POA: Diagnosis not present

## 2023-09-06 DIAGNOSIS — R310 Gross hematuria: Secondary | ICD-10-CM | POA: Diagnosis not present

## 2023-09-06 MED ORDER — IOHEXOL 350 MG/ML SOLN
100.0000 mL | Freq: Once | INTRAVENOUS | Status: AC | PRN
  Administered 2023-09-06: 100 mL via INTRAVENOUS

## 2023-09-13 ENCOUNTER — Ambulatory Visit (HOSPITAL_COMMUNITY)
Admission: RE | Admit: 2023-09-13 | Discharge: 2023-09-13 | Disposition: A | Discharge status: Home / Self Care | Source: Ambulatory Visit | Attending: Internal Medicine | Admitting: Internal Medicine

## 2023-09-13 DIAGNOSIS — F17200 Nicotine dependence, unspecified, uncomplicated: Secondary | ICD-10-CM | POA: Insufficient documentation

## 2023-09-13 DIAGNOSIS — F17210 Nicotine dependence, cigarettes, uncomplicated: Secondary | ICD-10-CM | POA: Diagnosis not present

## 2023-09-13 DIAGNOSIS — Z122 Encounter for screening for malignant neoplasm of respiratory organs: Secondary | ICD-10-CM | POA: Diagnosis not present

## 2023-09-18 DIAGNOSIS — C662 Malignant neoplasm of left ureter: Secondary | ICD-10-CM | POA: Diagnosis not present

## 2023-09-23 ENCOUNTER — Other Ambulatory Visit: Payer: Self-pay | Admitting: Urology

## 2023-09-26 NOTE — Patient Instructions (Addendum)
 SURGICAL WAITING ROOM VISITATION  Patients having surgery or a procedure may have no more than 2 support people in the waiting area - these visitors may rotate.    Children under the age of 63 must have an adult with them who is not the patient.  Due to an increase in RSV and influenza rates and associated hospitalizations, children ages 14 and under may not visit patients in [facility] hospitals.  Visitors with respiratory illnesses are discouraged from visiting and should remain at home.  If the patient needs to stay at the hospital during part of their recovery, the visitor guidelines for inpatient rooms apply. Pre-op nurse will coordinate an appropriate time for 1 support person to accompany patient in pre-op.  This support person may not rotate.    Please refer to the [facility] website for the visitor guidelines for Inpatients (after your surgery is over and you are in a regular room).       Your procedure is scheduled on: 10/07/23   Report to [facility] Main Entrance    Report to admitting at 1 PM   Call this number if you have problems the morning of surgery [phone]   Do not eat food :After Midnight.   After Midnight you may have the following liquids until 7 AM DAY OF SURGERY  Water        Oral Hygiene is also important to reduce your risk of infection.                                    Remember - BRUSH YOUR TEETH THE MORNING OF SURGERY WITH YOUR REGULAR TOOTHPASTE   Stop all vitamins and herbal supplements 7 days before surgery.   Take these medicines the morning of surgery with A SIP OF WATER : Rosuvastatin             You may not have any metal on your body including hair pins, jewelry, and body piercing             Do not wear make-up, lotions, powders, perfumes/cologne, or deodorant              Men may shave face and neck.   Do not bring valuables to the hospital. [place] IS NOT             RESPONSIBLE   FOR VALUABLES.   Contacts,  glasses, dentures or bridgework may not be worn into surgery.  DO NOT BRING YOUR HOME MEDICATIONS TO THE HOSPITAL. PHARMACY WILL DISPENSE MEDICATIONS LISTED ON YOUR MEDICATION LIST TO YOU DURING YOUR ADMISSION IN THE HOSPITAL!    Patients discharged on the day of surgery will not be allowed to drive home.  Someone NEEDS to stay with you for the first 24 hours after anesthesia.   Special Instructions: Bring a copy of your healthcare power of attorney and living will documents the day of surgery if you haven't scanned them before.              Please read over the following fact sheets you were given: IF YOU HAVE QUESTIONS ABOUT YOUR PRE-OP INSTRUCTIONS PLEASE CALL [phone] Luis Pruitt   If you received a COVID test during your pre-op visit  it is requested that you wear a mask when out in public, stay away from anyone that may not be feeling well and notify your surgeon if you develop symptoms. If you test  positive for Covid or have been in contact with anyone that has tested positive in the last 10 days please notify you surgeon.    [facility] - Preparing for Surgery Before surgery, you can play an important role.  Because skin is not sterile, your skin needs to be as free of germs as possible.  You can reduce the number of germs on your skin by washing with CHG (chlorahexidine gluconate) soap before surgery.  CHG is an antiseptic cleaner which kills germs and bonds with the skin to continue killing germs even after washing. Please DO NOT use if you have an allergy to CHG or antibacterial soaps.  If your skin becomes reddened/irritated stop using the CHG and inform your nurse when you arrive at Short Stay. Do not shave (including legs and underarms) for at least 48 hours prior to the first CHG shower.  You may shave your face/neck.  Please follow these instructions carefully:  1.  Shower with CHG Soap the night before surgery and the  morning of surgery.  2.  If you choose to wash your hair,  wash your hair first as usual with your normal  shampoo.  3.  After you shampoo, rinse your hair and body thoroughly to remove the shampoo.                             4.  Use CHG as you would any other liquid soap.  You can apply chg directly to the skin and wash.  Gently with a scrungie or clean washcloth.  5.  Apply the CHG Soap to your body ONLY FROM THE NECK DOWN.   Do   not use on face/ open                           Wound or open sores. Avoid contact with eyes, ears mouth and   genitals (private parts).                       Wash face,  Genitals (private parts) with your normal soap.             6.  Wash thoroughly, paying special attention to the area where your    surgery  will be performed.  7.  Thoroughly rinse your body with warm water  from the neck down.  8.  DO NOT shower/wash with your normal soap after using and rinsing off the CHG Soap.                9.  Pat yourself dry with a clean towel.            10.  Wear clean pajamas.            11.  Place clean sheets on your bed the night of your first shower and do not  sleep with pets. Day of Surgery : Do not apply any lotions/deodorants the morning of surgery.  Please wear clean clothes to the hospital/surgery center.  FAILURE TO FOLLOW THESE INSTRUCTIONS MAY RESULT IN THE CANCELLATION OF YOUR SURGERY  PATIENT SIGNATURE_________________________________  NURSE SIGNATURE__________________________________  ________________________________________________________________________

## 2023-09-26 NOTE — Progress Notes (Signed)
 COVID Vaccine received:  []  No [x]  Yes Date of any COVID positive Test in last 90 days: no PCP - Artemisa Bile MD Cardiologist - n/a  Chest x-ray - Chest CT 09/13/23 Epic EKG -   Stress Test -  ECHO -  Cardiac Cath -   Bowel Prep - [x]  No  []   Yes ______  Pacemaker / ICD device [x]  No []  Yes   Spinal Cord Stimulator:[x]  No []  Yes       History of Sleep Apnea? [x]  No []  Yes   CPAP used?- [x]  No []  Yes    Does the patient monitor blood sugar?          [x]  No []  Yes  []  N/A  Patient has: [x]  NO Hx DM   []  Pre-DM                 []  DM1  []   DM2 Does patient have a [employer] or Dexacom? []  No []  Yes   Fasting Blood Sugar Ranges-  Checks Blood Sugar _____ times a day  GLP1 agonist / usual dose - no GLP1 instructions:  SGLT-2 inhibitors / usual dose - no SGLT-2 instructions:   Blood Thinner / Instructions:no  Comments:   Activity level: Patient is able  to climb a flight of stairs without difficulty; [x]  No CP  [x]  No SOB,   Patient can perform ADLs without assistance.   Anesthesia review:   Patient denies shortness of breath, fever, cough and chest pain at PAT appointment.  Patient verbalized understanding and agreement to the Pre-Surgical Instructions that were given to them at this PAT appointment. Patient was also educated of the need to review these PAT instructions again prior to his/her surgery.I reviewed the appropriate phone numbers to call if they have any and questions or concerns.

## 2023-09-27 ENCOUNTER — Encounter (HOSPITAL_COMMUNITY)
Admission: RE | Admit: 2023-09-27 | Discharge: 2023-09-27 | Disposition: A | Discharge status: Home / Self Care | Source: Ambulatory Visit | Attending: Urology | Admitting: Urology

## 2023-09-27 ENCOUNTER — Encounter (HOSPITAL_COMMUNITY): Payer: Self-pay

## 2023-09-27 ENCOUNTER — Other Ambulatory Visit: Payer: Self-pay

## 2023-09-27 VITALS — BP 133/75 | HR 56 | Temp 97.7°F | Resp 16 | Ht 73.0 in | Wt 206.0 lb

## 2023-09-27 DIAGNOSIS — Z01812 Encounter for preprocedural laboratory examination: Secondary | ICD-10-CM | POA: Insufficient documentation

## 2023-09-27 DIAGNOSIS — Z01818 Encounter for other preprocedural examination: Secondary | ICD-10-CM

## 2023-09-27 LAB — CBC
HCT: 43.1 % (ref 39.0–52.0)
Hemoglobin: 14.4 g/dL (ref 13.0–17.0)
MCH: 32.7 pg (ref 26.0–34.0)
MCHC: 33.4 g/dL (ref 30.0–36.0)
MCV: 98 fL (ref 80.0–100.0)
Platelets: 207 10*3/uL (ref 150–400)
RBC: 4.4 MIL/uL (ref 4.22–5.81)
RDW: 12.3 % (ref 11.5–15.5)
WBC: 7 10*3/uL (ref 4.0–10.5)
nRBC: 0 % (ref 0.0–0.2)

## 2023-10-04 NOTE — H&P (Signed)
 Office Visit Report     09/18/2023   --------------------------------------------------------------------------------   Luis Pruitt. [place]  MRN: [id number]  DOB: Mar 16, 1961, 63 year old Male  SSN:    PRIMARY CARE:  Dolly Fret. Sheralyn Dies, MD  PRIMARY CARE FAX:  [phone]  REFERRING:    PROVIDER:  Florencio Hunting, M.D.  LOCATION:  [facility] [phone]     --------------------------------------------------------------------------------   CC/HPI: Hematuria/left ureteral filling defect   Mr. Luis Pruitt is a 63 year old gentleman who has been a longtime smoker with over 50 pack years. He is an active smoker and has been trying to quit but is still smoking approximately 10 to 12 cigarettes/day. He developed the acute onset of painless gross hematuria approximately 2 weeks ago and has now noted some dull achiness in his left flank region. He denies any fever. He denies a history of GU malignancy or surgery. He was seen by his primary care physician, Dr. Artemisa Bile, and had a hematuria protocol CT scan performed which confirmed an obvious filling defect in the proximal left ureter concerning for possible urothelial tumor.     ALLERGIES: None   MEDICATIONS: Rosuvastatin Calcium     GU PSH: None   NON-GU PSH: Appendectomy     GU PMH: No GU PMH    NON-GU PMH: No Non-GU PMH    FAMILY HISTORY: 2 daughters - Runs in Family   SOCIAL HISTORY: Marital Status: Married Preferred Language: English; Ethnicity: Not Hispanic Or Latino Current Smoking Status: Patient smokes occasionally.   Tobacco Use Assessment Completed: Used Tobacco in last 30 days? Drinks 4+ caffeinated drinks per day.    REVIEW OF SYSTEMS:    GU Review Male:   Patient reports frequent urination. Patient denies hard to postpone urination, burning/ pain with urination, get up at night to urinate, leakage of urine, stream starts and stops, trouble starting your streams, and have to strain to urinate .   Gastrointestinal (Lower):   Patient denies diarrhea and constipation.  Gastrointestinal (Upper):   Patient denies nausea and vomiting.  Constitutional:   Patient reports fatigue. Patient denies fever, night sweats, and weight loss.  Skin:   Patient denies skin rash/ lesion and itching.  Eyes:   Patient denies blurred vision and double vision.  Ears/ Nose/ Throat:   Patient denies sinus problems and sore throat.  Hematologic/Lymphatic:   Patient reports easy bruising. Patient denies swollen glands.  Cardiovascular:   Patient denies leg swelling and chest pains.  Respiratory:   Patient denies cough and shortness of breath.  Endocrine:   Patient denies excessive thirst.  Musculoskeletal:   Patient reports back pain. Patient denies joint pain.  Neurological:   Patient denies headaches.  Psychologic:   Patient reports depression and anxiety.    VITAL SIGNS:      09/18/2023 10:25 AM  Weight 205 lb / 92.99 kg  Height 73 in / 185.42 cm  BP 127/67 mmHg  Pulse 61 /min  BMI 27.0 kg/m   MULTI-SYSTEM PHYSICAL EXAMINATION:    Constitutional: Well-nourished. No physical deformities. Normally developed. Good grooming.  Respiratory: No labored breathing, no use of accessory muscles. Clear bilaterally.  Cardiovascular: Normal temperature, normal extremity pulses, no swelling, no varicosities. Regular rate and rhythm.  Gastrointestinal: No mass, no tenderness, no rigidity, non obese abdomen. He has a well-healed right sided vertical scar presumably from an appendectomy in 1982.     Complexity of Data:  Records Review:   Previous Patient Records  X-Ray Review:  C.T. Abdomen/Pelvis: Reviewed Films.    Notes:                     EXAMINATION: CT ABDOMEN W WO CONTRAST   CLINICAL INDICATION: Male, 63 years old. Gross hematuria   TECHNIQUE: Axial CT of the abdomen with and without 100 cc Omnipaque  300  intravenous contrast. Multiplanar reformations provided. Unless otherwise  specified, incidental  thyroid, adrenal, renal lesions do not require dedicated  imaging follow up. Additionally, any mentioned pulmonary nodules do not require  dedicated imaging follow-up based on the Fleischner guidelines unless otherwise  specified. Coronary calcifications are not identified unless otherwise  specified.   COMPARISON:   FINDINGS:   Lung bases are clear. The heart is normal in size. The liver appears normal.  The gallbladder is normal. The spleen is normal. The pancreas is normal. The  adrenals are normal. There is an enhancing mass noted in the proximal left  ureter measuring 10 x 8 mm with mild hydroureteronephrosis.   The abdominal aorta is normal in caliber. Scattered atherosclerotic changes are  present. The bladder is normal. The prostate is normal. Large and small bowel  loops are otherwise within normal limits. There is no free fluid or adenopathy.  There are degenerative changes of the spine.    IMPRESSION:   Enhancing mass within the proximal left ureter concerning for transitional cell  carcinoma resulting in mild left hydroureteronephrosis.   DOSE REDUCTION: This exam was performed according to our departmental  dose-optimization program which includes automated exposure control, adjustment  of the mA and/or kV according to patient size and/or use of iterative  reconstruction technique.   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          Document Letter(s):  Created for Patient: Clinical Summary         Notes:   1. Left ureteral mass consistent with urothelial cancer: I have recommended that he proceed with further diagnostic evaluation including cystoscopy, left retrograde pyelography, left ureteroscopy with biopsy, and left ureteral stent placement. We have reviewed that procedure in detail including potential risks, complications, and expected recovery process. He will then follow-up for a cancer consultation once his biopsy results have resulted. All questions were answered to his stated satisfaction. This will be scheduled.   CC: Dr. Artemisa Bile    * Signed by Florencio Hunting, M.D. on 09/18/23 at 3:29 PM (EDT)*

## 2023-10-07 ENCOUNTER — Ambulatory Visit (HOSPITAL_COMMUNITY)

## 2023-10-07 ENCOUNTER — Encounter (HOSPITAL_COMMUNITY)
Admission: RE | Disposition: A | Discharge status: Home / Self Care | Payer: Self-pay | Source: Ambulatory Visit | Attending: Urology

## 2023-10-07 ENCOUNTER — Ambulatory Visit (HOSPITAL_COMMUNITY)
Admission: RE | Admit: 2023-10-07 | Discharge: 2023-10-07 | Disposition: A | Discharge status: Home / Self Care | Source: Ambulatory Visit | Attending: Urology | Admitting: Urology

## 2023-10-07 ENCOUNTER — Ambulatory Visit (HOSPITAL_COMMUNITY): Admitting: Anesthesiology

## 2023-10-07 ENCOUNTER — Other Ambulatory Visit: Payer: Self-pay

## 2023-10-07 ENCOUNTER — Encounter (HOSPITAL_COMMUNITY): Payer: Self-pay | Admitting: Urology

## 2023-10-07 DIAGNOSIS — D0919 Carcinoma in situ of other urinary organs: Secondary | ICD-10-CM | POA: Diagnosis not present

## 2023-10-07 DIAGNOSIS — D4122 Neoplasm of uncertain behavior of left ureter: Secondary | ICD-10-CM | POA: Diagnosis not present

## 2023-10-07 DIAGNOSIS — C662 Malignant neoplasm of left ureter: Secondary | ICD-10-CM | POA: Diagnosis not present

## 2023-10-07 DIAGNOSIS — D4959 Neoplasm of unspecified behavior of other genitourinary organ: Secondary | ICD-10-CM | POA: Diagnosis not present

## 2023-10-07 DIAGNOSIS — F17210 Nicotine dependence, cigarettes, uncomplicated: Secondary | ICD-10-CM | POA: Diagnosis not present

## 2023-10-07 DIAGNOSIS — N2889 Other specified disorders of kidney and ureter: Secondary | ICD-10-CM | POA: Diagnosis not present

## 2023-10-07 HISTORY — PX: CYSTOSCOPY W/ RETROGRADES: SHX1426

## 2023-10-07 HISTORY — DX: Hyperlipidemia, unspecified: E78.5

## 2023-10-07 HISTORY — PX: CYSTOSCOPY WITH URETEROSCOPY AND STENT PLACEMENT: SHX6377

## 2023-10-07 SURGERY — CYSTOURETEROSCOPY, WITH STENT INSERTION
Anesthesia: General | Laterality: Left

## 2023-10-07 MED ORDER — CEFAZOLIN SODIUM-DEXTROSE 2-4 GM/100ML-% IV SOLN
2.0000 g | INTRAVENOUS | Status: AC
  Administered 2023-10-07: 2 g via INTRAVENOUS
  Filled 2023-10-07: qty 100

## 2023-10-07 MED ORDER — DEXAMETHASONE SODIUM PHOSPHATE 10 MG/ML IJ SOLN
INTRAMUSCULAR | Status: DC | PRN
  Administered 2023-10-07: 10 mg via INTRAVENOUS

## 2023-10-07 MED ORDER — SODIUM CHLORIDE 0.9 % IR SOLN
Status: DC | PRN
  Administered 2023-10-07: 3000 mL

## 2023-10-07 MED ORDER — IOHEXOL 300 MG/ML  SOLN
INTRAMUSCULAR | Status: DC | PRN
  Administered 2023-10-07: 50 mL

## 2023-10-07 MED ORDER — GLYCOPYRROLATE 0.2 MG/ML IJ SOLN
INTRAMUSCULAR | Status: DC | PRN
  Administered 2023-10-07: .2 mg via INTRAVENOUS

## 2023-10-07 MED ORDER — MIDAZOLAM HCL 2 MG/2ML IJ SOLN
INTRAMUSCULAR | Status: AC
  Filled 2023-10-07: qty 2

## 2023-10-07 MED ORDER — ATROPINE SULFATE 1 MG/ML IV SOLN
INTRAVENOUS | Status: DC | PRN
  Administered 2023-10-07: .2 mg via INTRAVENOUS

## 2023-10-07 MED ORDER — PROPOFOL 10 MG/ML IV BOLUS
INTRAVENOUS | Status: DC | PRN
  Administered 2023-10-07: 180 mg via INTRAVENOUS

## 2023-10-07 MED ORDER — ONDANSETRON HCL 4 MG/2ML IJ SOLN
INTRAMUSCULAR | Status: DC | PRN
Start: 2023-10-07 — End: 2023-10-07
  Administered 2023-10-07: 4 mg via INTRAVENOUS

## 2023-10-07 MED ORDER — MIDAZOLAM HCL 5 MG/5ML IJ SOLN
INTRAMUSCULAR | Status: DC | PRN
  Administered 2023-10-07: 2 mg via INTRAVENOUS

## 2023-10-07 MED ORDER — FENTANYL CITRATE (PF) 100 MCG/2ML IJ SOLN
INTRAMUSCULAR | Status: AC
Start: 2023-10-07 — End: ?
  Filled 2023-10-07: qty 2

## 2023-10-07 MED ORDER — PHENAZOPYRIDINE HCL 200 MG PO TABS: 200.0000 mg | ORAL_TABLET | Freq: Three times a day (TID) | ORAL | 0 refills | Status: DC | PRN

## 2023-10-07 MED ORDER — CHLORHEXIDINE GLUCONATE 0.12 % MT SOLN
15.0000 mL | Freq: Once | OROMUCOSAL | Status: AC
  Administered 2023-10-07: 15 mL via OROMUCOSAL

## 2023-10-07 MED ORDER — LACTATED RINGERS IV SOLN: INTRAVENOUS | Status: DC

## 2023-10-07 MED ORDER — ORAL CARE MOUTH RINSE: 15.0000 mL | Freq: Once | OROMUCOSAL | Status: AC

## 2023-10-07 MED ORDER — FENTANYL CITRATE (PF) 100 MCG/2ML IJ SOLN
INTRAMUSCULAR | Status: DC | PRN
  Administered 2023-10-07 (×4): 50 ug via INTRAVENOUS

## 2023-10-07 MED ORDER — ATROPINE SULFATE 1 MG/10ML IJ SOSY
PREFILLED_SYRINGE | INTRAMUSCULAR | Status: AC
  Filled 2023-10-07: qty 10

## 2023-10-07 MED ORDER — LIDOCAINE 2% (20 MG/ML) 5 ML SYRINGE
INTRAMUSCULAR | Status: DC | PRN
  Administered 2023-10-07: 80 mg via INTRAVENOUS

## 2023-10-07 MED ORDER — FENTANYL CITRATE (PF) 100 MCG/2ML IJ SOLN
INTRAMUSCULAR | Status: AC
  Filled 2023-10-07: qty 2

## 2023-10-07 SURGICAL SUPPLY — 26 items
BAG COUNTER SPONGE SURGICOUNT (BAG) IMPLANT
BAG URINE DRAIN 2000ML AR STRL (UROLOGICAL SUPPLIES) IMPLANT
BAG URO CATCHER STRL LF (MISCELLANEOUS) ×2 IMPLANT
BASKET STNLS GEMINI 4WIRE 3FR (BASKET) ×1 IMPLANT
BASKET ZERO TIP NITINOL 2.4FR (BASKET) ×1 IMPLANT
CATH URETL OPEN END 6FR 70 (CATHETERS) IMPLANT
CLOTH BEACON ORANGE TIMEOUT ST (SAFETY) ×1 IMPLANT
DRAPE FOOT SWITCH (DRAPES) ×2 IMPLANT
ELECT REM PT RETURN 15FT ADLT (MISCELLANEOUS) ×2 IMPLANT
GLOVE SURG LX STRL 7.5 STRW (GLOVE) ×2 IMPLANT
GOWN STRL REUS W/ TWL XL LVL3 (GOWN DISPOSABLE) ×2 IMPLANT
GUIDEWIRE STR DUAL SENSOR (WIRE) ×2 IMPLANT
GUIDEWIRE ZIPWRE .038 STRAIGHT (WIRE) IMPLANT
IV NS 1000ML BAXH (IV SOLUTION) ×1 IMPLANT
KIT TURNOVER KIT A (KITS) ×1 IMPLANT
LOOP CUT BIPOLAR 24F LRG (ELECTROSURGICAL) IMPLANT
MANIFOLD NEPTUNE II (INSTRUMENTS) ×2 IMPLANT
NS IRRIG 1000ML POUR BTL (IV SOLUTION) IMPLANT
PACK CYSTO (CUSTOM PROCEDURE TRAY) ×2 IMPLANT
PAD TELFA 2X3 NADH STRL (GAUZE/BANDAGES/DRESSINGS) ×1 IMPLANT
SHEATH NAVIGATOR HD 12/14X36 (SHEATH) ×1 IMPLANT
SYR 10ML LL (SYRINGE) ×1 IMPLANT
SYRINGE TOOMEY IRRIG 70ML (MISCELLANEOUS) IMPLANT
TRACTIP FLEXIVA PULS ID 200XHI (Laser) ×1 IMPLANT
TUBING CONNECTING 10 (TUBING) ×2 IMPLANT
TUBING UROLOGY SET (TUBING) ×2 IMPLANT

## 2023-10-07 NOTE — Transfer of Care (Signed)
 Immediate Anesthesia Transfer of Care Note  Patient: Luis Pruitt  Procedure(s) Performed: CYSTOSCOPY, WITH BIOPSY CYSTOURETEROSCOPY, WITH STENT INSERTION CYSTOSCOPY, WITH RETROGRADE PYELOGRAM  Patient Location: PACU  Anesthesia Type:General  Level of Consciousness: sedated, patient cooperative, and responds to stimulation  Airway & Oxygen Therapy: Patient Spontanous Breathing and Patient connected to face mask oxygen  Post-op Assessment: Report given to RN and Post -op Vital signs reviewed and stable  Post vital signs: Reviewed and stable  Last Vitals:  Vitals Value Taken Time  BP 149/79 10/07/23 1605  Temp    Pulse 74 10/07/23 1606  Resp 16 10/07/23 1606  SpO2 100 % 10/07/23 1606  Vitals shown include unfiled device data.  Last Pain:  Vitals:   10/07/23 1316  TempSrc:   PainSc: 0-No pain         Complications: No notable events documented.

## 2023-10-07 NOTE — Interval H&P Note (Signed)
 History and Physical Interval Note:  10/07/2023 1:57 PM  Luis Pruitt  has presented today for surgery, with the diagnosis of LEFT URETERAL MASS.  The various methods of treatment have been discussed with the patient and family. After consideration of risks, benefits and other options for treatment, the patient has consented to  Procedure(s): CYSTOSCOPY, WITH BIOPSY (N/A) CYSTOURETEROSCOPY, WITH STENT INSERTION (N/A) CYSTOSCOPY, WITH RETROGRADE PYELOGRAM (N/A) as a surgical intervention.  The patient's history has been reviewed, patient examined, no change in status, stable for surgery.  I have reviewed the patient's chart and labs.  Questions were answered to the patient's satisfaction.     [employer]

## 2023-10-07 NOTE — Anesthesia Preprocedure Evaluation (Signed)
 Anesthesia Evaluation  Patient identified by MRN, date of birth, ID band Patient awake    Reviewed: Allergy & Precautions, NPO status , Patient's Chart, lab work & pertinent test results  History of Anesthesia Complications Negative for: history of anesthetic complications  Airway Mallampati: II  TM Distance: >3 FB Neck ROM: Full    Dental  (+) Teeth Intact, Dental Advisory Given   Pulmonary neg shortness of breath, neg sleep apnea, neg COPD, neg recent URI, former smoker   breath sounds clear to auscultation       Cardiovascular negative cardio ROS  Rhythm:Regular     Neuro/Psych negative neurological ROS  negative psych ROS   GI/Hepatic negative GI ROS, Neg liver ROS,,,  Endo/Other  negative endocrine ROS    Renal/GU negative Renal ROS     Musculoskeletal negative musculoskeletal ROS (+)    Abdominal   Peds  Hematology negative hematology ROS (+)   Anesthesia Other Findings   Reproductive/Obstetrics                              Anesthesia Physical Anesthesia Plan  ASA: 2  Anesthesia Plan: General   Post-op Pain Management: Ofirmev IV (intra-op)*   Induction: Intravenous  PONV Risk Score and Plan: 2 and Ondansetron and Dexamethasone   Airway Management Planned: LMA and Oral ETT  Additional Equipment: None  Intra-op Plan:   Post-operative Plan: Extubation in OR  Informed Consent: I have reviewed the patients History and Physical, chart, labs and discussed the procedure including the risks, benefits and alternatives for the proposed anesthesia with the patient or authorized representative who has indicated his/her understanding and acceptance.     Dental advisory given  Plan Discussed with: CRNA  Anesthesia Plan Comments:          Anesthesia Quick Evaluation

## 2023-10-07 NOTE — Op Note (Signed)
 Preoperative diagnosis: Left ureteral tumor  Postoperative diagnosis: Left ureteral tumor  Procedures: 1.  Cystoscopy 2.  Left retrograde pyelography with interpretation 3.  Left ureteroscopy with biopsy of left ureteral tumor 4.  Left ureteroscopy laser ablation of left ureteral tumor 5.  Washing from left ureter for cytology 6.  Left ureteral stent placement (6 x 26-no string)  Surgeon: Izetta Marshall MD  Anesthesia: General  Complications: None  EBL: Minimal  Specimens: 1.  Left ureteral tumor 2.  Cytology from left ureter  Disposition of specimens: Pathology  Intraoperative findings: A left retrograde pyelogram was performed with a 6 French ureteral catheter and Omnipaque  contrast.  This revealed a large filling defect within the proximal left ureter consistent with the patient's preoperative CT imaging findings.  Indication: Luis Pruitt is a 63 year old gentleman who developed hematuria.  A hematuria protocol CT scan was performed indicating a left proximal ureteral filling defect concerning for a possible urothelial tumor.  After reviewing options, it was recommended that he undergo further diagnostic procedures as listed above.  The potential risks, complications, and the alternative options were discussed in detail.  Informed consent was obtained.  Description of procedure: The patient was taken to the operating room and a general anesthetic was administered.  He was given preoperative antibiotics, placed in the dorsolithotomy position, and prepped and draped in the usual sterile fashion.  A preoperative timeout was performed.  Cystourethroscopy was performed.  The distal urethra was slightly narrowed and required dilation with Dustin Gimenez sounds.  A 22 French cystoscope sheath was unable to be inserted into the bladder.  The anterior posterior urethra were otherwise normal.  Inspection of the bladder systematically revealed no bladder tumors, stones, or other mucosal  pathology.  Attention then turned to the left ureter which was intubated with a 6 French ureteral catheter.  Omnipaque  contrast was injected with findings as dictated above.  A 0.38 sensor guidewire was then advanced up the left ureter easily into the left renal collecting system under fluoroscopic guidance.  A 12/14 ureteral access sheath was then advanced over the wire to the proximal ureter at a point below the filling defect.  A digital flexible ureteroscope was then advanced up to the filling defect and there was tumor noted to be present off the wall of the ureter extending approximately 270 degrees circumferentially around the ureter at this site.  I did take a few biopsies with a Gemini stone basket which was only mildly successful.  I then attempted a biopsy with a flexible ureteroscopic biopsy forceps which was also only mildly successful.  Finally, additional biopsies were taken after part of the tumor was lasered off the wall and removed with a 0 tip nitinol basket.  I then performed laser ablation on a setting of 1.0 J and 6 Hz.  A washing from the proximal left ureter had also been obtained at the beginning of the procedure and sent for cytology I then inserted a sensor guidewire back up the left ureter and the ureteroscope and access sheath were removed.  The wire was backloaded on the cystoscope and a 6 x 26 double-J ureteral stent was advanced over the wire using Seldinger technique and position appropriately under fluoroscopic and cystoscopic guidance.  The wire was removed and the good curl was noted in the renal pelvis as well as within the bladder.  The patient's bladder was emptied and the procedure was ended.  He tolerated the procedure well without complications.  He was able  to be extubated and transferred to the recovery unit in satisfactory condition.

## 2023-10-07 NOTE — Discharge Instructions (Signed)

## 2023-10-07 NOTE — Anesthesia Postprocedure Evaluation (Signed)
 Anesthesia Post Note  Patient: Luis Pruitt  Procedure(s) Performed: URETEROSCOPY WITH BIOPSY, LASER ABLATION OF LEFT URETERAL TUMOR, WITH STENT INSERTION (Left) CYSTOSCOPY, WITH RETROGRADE PYELOGRAM WITH STENT PLACEMENT (Left)     Patient location during evaluation: PACU Anesthesia Type: General Level of consciousness: awake and alert Pain management: pain level controlled Vital Signs Assessment: post-procedure vital signs reviewed and stable Respiratory status: spontaneous breathing, nonlabored ventilation and respiratory function stable Cardiovascular status: blood pressure returned to baseline and stable Postop Assessment: no apparent nausea or vomiting Anesthetic complications: no   No notable events documented.  Last Vitals:  Vitals:   10/07/23 1617 10/07/23 1630  BP: (!) 143/97 (!) 156/76  Pulse: 66 (!) 59  Resp: 11 13  Temp:    SpO2: 100% 98%    Last Pain:  Vitals:   10/07/23 1630  TempSrc:   PainSc: 0-No pain                 Brendolyn Stockley

## 2023-10-07 NOTE — Anesthesia Procedure Notes (Signed)
 Procedure Name: LMA Insertion Date/Time: 10/07/2023 2:49 PM  Performed by: Guinevere Lefevre, CRNAPre-anesthesia Checklist: Patient identified, Emergency Drugs available, Suction available, Patient being monitored and Timeout performed Patient Re-evaluated:Patient Re-evaluated prior to induction Oxygen Delivery Method: Circle system utilized Preoxygenation: Pre-oxygenation with 100% oxygen Induction Type: IV induction Ventilation: Mask ventilation without difficulty LMA: LMA inserted LMA Size: 4.0 Number of attempts: 1 Placement Confirmation: positive ETCO2 and breath sounds checked- equal and bilateral Tube secured with: Tape Dental Injury: Teeth and Oropharynx as per pre-operative assessment

## 2023-10-08 ENCOUNTER — Encounter (HOSPITAL_COMMUNITY): Payer: Self-pay | Admitting: Urology

## 2023-10-09 DIAGNOSIS — Z23 Encounter for immunization: Secondary | ICD-10-CM | POA: Diagnosis not present

## 2023-10-11 LAB — SURGICAL PATHOLOGY

## 2023-10-11 LAB — CYTOLOGY - NON PAP

## 2023-10-18 ENCOUNTER — Ambulatory Visit: Attending: Internal Medicine | Admitting: Internal Medicine

## 2023-10-18 ENCOUNTER — Encounter: Payer: Self-pay | Admitting: Internal Medicine

## 2023-10-18 VITALS — BP 118/72 | HR 61 | Ht 73.0 in | Wt 202.4 lb

## 2023-10-18 DIAGNOSIS — Z136 Encounter for screening for cardiovascular disorders: Secondary | ICD-10-CM | POA: Diagnosis not present

## 2023-10-18 DIAGNOSIS — I2510 Atherosclerotic heart disease of native coronary artery without angina pectoris: Secondary | ICD-10-CM | POA: Insufficient documentation

## 2023-10-18 MED ORDER — METOPROLOL TARTRATE 50 MG PO TABS: 50.0000 mg | ORAL_TABLET | ORAL | 0 refills | Status: DC

## 2023-10-18 NOTE — Progress Notes (Signed)
 Cardiology Office Note  Date: 10/18/2023   ID: Luis Pruitt, DOB 04-23-1961, MRN [id number]  PCP:  Artemisa Bile, MD  Cardiologist:  Lasalle Pointer, MD Electrophysiologist:  None   History of Present Illness: Luis Pruitt is a 63 y.o. male  Referred to cardiology clinic for evaluation of age advanced coronary artery calcification in the distal left main evident on serial CT chest.  Coronary artery calcium scoring showed coronary calcium score of 9 in the left main.  He walks for 2 to 3 miles and plays golf extensively for most days of the week.  He does not have any symptoms of angina or DOE.  He does not have any other symptoms of dizziness, syncope, palpitations or leg swelling.  He recently underwent left ureteral stent placement and ablation of left urothelial tumor on 10/04/2023 with no postoperative complications.  He reported that he might be scheduled for nephrectomy in the future.  Past Medical History:  Diagnosis Date   Hyperlipidemia     Past Surgical History:  Procedure Laterality Date   APPENDECTOMY     COLONOSCOPY  02/28/2012   Procedure: COLONOSCOPY;  Surgeon: Ruby Corporal, MD;  Location: [facility];  Service: Endoscopy;  Laterality: N/A;  830   COLONOSCOPY N/A 05/03/2017   Procedure: COLONOSCOPY;  Surgeon: Ruby Corporal, MD;  Location: [facility];  Service: Endoscopy;  Laterality: N/A;  200   CYSTOSCOPY W/ RETROGRADES Left 10/07/2023   Procedure: CYSTOSCOPY, WITH RETROGRADE PYELOGRAM WITH STENT PLACEMENT;  Surgeon: Florencio Hunting, MD;  Location: [facility];  Service: Urology;  Laterality: Left;   CYSTOSCOPY WITH URETEROSCOPY AND STENT PLACEMENT Left 10/07/2023   Procedure: URETEROSCOPY WITH BIOPSY, LASER ABLATION OF LEFT URETERAL TUMOR, WITH STENT INSERTION;  Surgeon: Florencio Hunting, MD;  Location: [facility];  Service: Urology;  Laterality: Left;   EXPLORATORY LAPAROTOMY     with appendectomy    Current Outpatient Medications  Medication Sig Dispense  Refill   rosuvastatin (CRESTOR) 10 MG tablet Take 10 mg by mouth at bedtime.     No current facility-administered medications for this visit.   Allergies:  Patient has no known allergies.   Social History: The patient  reports that he has quit smoking. His smoking use included cigarettes. He has never used smokeless tobacco. He reports current alcohol use. He reports current drug use. Drug: Marijuana.   Family History: The patient's family history includes Brain cancer in his brother; COPD in his mother; Colon cancer in his father; Melanoma in his father; Multiple sclerosis in his sister.   ROS:  Please see the history of present illness. Otherwise, complete review of systems is positive for none  All other systems are reviewed and negative.   Physical Exam: VS:  BP 118/72   Pulse 61   Ht 6' 1 (1.854 m)   Wt 202 lb 6.4 oz (91.8 kg)   SpO2 97%   BMI 26.70 kg/m , BMI Body mass index is 26.7 kg/m.  Wt Readings from Last 3 Encounters:  10/18/23 202 lb 6.4 oz (91.8 kg)  10/07/23 205 lb 0.4 oz (93 kg)  09/27/23 206 lb (93.4 kg)    General: Patient appears comfortable at rest. HEENT: Conjunctiva and lids normal, oropharynx clear with moist mucosa. Neck: Supple, no elevated JVP or carotid bruits, no thyromegaly. Lungs: Clear to auscultation, nonlabored breathing at rest. Cardiac: Regular rate and rhythm, no S3 or significant systolic murmur, no pericardial rub. Abdomen: Soft, nontender, no hepatomegaly, bowel  sounds present, no guarding or rebound. Extremities: No pitting edema, distal pulses 2+. Skin: Warm and dry. Musculoskeletal: No kyphosis. Neuropsychiatric: Alert and oriented x3, affect grossly appropriate.  Recent Labwork: 09/27/2023: Hemoglobin 14.4; Platelets 207  No results found for: CHOL, TRIG, HDL, CHOLHDL, VLDL, LDLCALC, LDLDIRECT   Assessment and Plan:   Age advanced coronary calcification of distal left main: Asymptomatic, METs more than 4.  Walks  2 to 3 miles and plays golf extensively most days of the week with no symptoms of angina or DOE. Coronary calcium score is 9 located in left main and serial CT imaging findings showed age advanced coronary calcification in the distal left main.  He underwent left ureteral stent placement and ablation of left ureteral tumor on 10/04/2023 with no postoperative complications. Due to age advanced coronary artery calcification in the distal left main, will obtain CT cardiac to rule out any flow-limiting lesion and prevent any peri-operative MI (he reported that he might be scheduled for nephrectomy).  Continue rosuvastatin 40 mg nightly, will start aspirin based on CT cardiac findings.      Medication Adjustments/Labs and Tests Ordered: Current medicines are reviewed at length with the patient today.  Concerns regarding medicines are outlined above.    Disposition:  Follow up pending results  Signed Renalda Locklin Priya Lareina Espino, MD, 10/18/2023 10:40 AM    [facility] [address] [place], [place], [place] [address]

## 2023-10-18 NOTE — Patient Instructions (Addendum)
 Medication Instructions:  Your physician recommends that you continue on your current medications as directed. Please refer to the Current Medication list given to you today.  Labwork: BMET today at [facility] ([address] [place]) Non-fasting  Testing/Procedures: Coronary CTA-see instructions below  Follow-Up: Your physician recommends that you schedule a follow-up appointment in: pending  Any Other Special Instructions Will Be Listed Below (If Applicable).  If you need a refill on your cardiac medications before your next appointment, please call your pharmacy.    Your cardiac CT will be scheduled at one of the below locations:   [facility] [address]  [place], [place] [address] Opening September 02, 2023  If scheduled at the Heart and Vascular Tower at [facility], please enter the parking lot using the [address] street entrance and use the FREE valet service at the patient drop-off area. Enter the buidling and check-in with registration on the main floor.  Please follow these instructions carefully (unless otherwise directed):  An IV will be required for this test and Nitroglycerin will be given.  Hold all erectile dysfunction medications at least 3 days (72 hrs) prior to test. (Ie viagra, cialis, sildenafil, tadalafil, etc)   On the Night Before the Test: Be sure to Drink plenty of water . Do not consume any caffeinated/decaffeinated beverages or chocolate 12 hours prior to your test. Do not take any antihistamines 12 hours prior to your test. If the patient has contrast allergy: No contrast allergy  On the Day of the Test: Drink plenty of water  until 1 hour prior to the test. Do not eat any food 1 hour prior to test. You may take your regular medications prior to the test.  Take metoprolol (Lopressor) 50 mg two hours prior to test. Patients who wear a continuous glucose monitor MUST remove the device prior to scanning.    After the  Test: Drink plenty of water . After receiving IV contrast, you may experience a mild flushed feeling. This is normal. On occasion, you may experience a mild rash up to 24 hours after the test. This is not dangerous. If this occurs, you can take Benadryl 25 mg, Zyrtec, Claritin, or Allegra and increase your fluid intake. (Patients taking Tikosyn should avoid Benadryl, and may take Zyrtec, Claritin, or Allegra) If you experience trouble breathing, this can be serious. If it is severe call 911 IMMEDIATELY. If it is mild, please call our office.  We will call to schedule your test 2-4 weeks out understanding that some insurance companies will need an authorization prior to the service being performed.   For more information and frequently asked questions, please visit our website : [email or web address]  For non-scheduling related questions, please contact the cardiac imaging nurse navigator should you have any questions/concerns: Cardiac Imaging Nurse Navigators Direct Office Dial: [phone]   For scheduling needs, including cancellations and rescheduling, please call [place], [phone].

## 2023-10-21 DIAGNOSIS — I2510 Atherosclerotic heart disease of native coronary artery without angina pectoris: Secondary | ICD-10-CM | POA: Diagnosis not present

## 2023-10-22 DIAGNOSIS — C662 Malignant neoplasm of left ureter: Secondary | ICD-10-CM | POA: Diagnosis not present

## 2023-10-22 LAB — BASIC METABOLIC PANEL WITH GFR
BUN/Creatinine Ratio: 14 (ref 10–24)
BUN: 14 mg/dL (ref 8–27)
CO2: 19 mmol/L — ABNORMAL LOW (ref 20–29)
Calcium: 9.1 mg/dL (ref 8.6–10.2)
Chloride: 102 mmol/L (ref 96–106)
Creatinine, Ser: 1.01 mg/dL (ref 0.76–1.27)
Glucose: 128 mg/dL — ABNORMAL HIGH (ref 70–99)
Potassium: 4.5 mmol/L (ref 3.5–5.2)
Sodium: 139 mmol/L (ref 134–144)
eGFR: 84 mL/min/{1.73_m2} (ref >59)

## 2023-10-23 ENCOUNTER — Telehealth (HOSPITAL_COMMUNITY): Payer: Self-pay | Admitting: *Deleted

## 2023-10-23 ENCOUNTER — Telehealth: Payer: Self-pay | Admitting: Internal Medicine

## 2023-10-23 ENCOUNTER — Other Ambulatory Visit: Payer: Self-pay | Admitting: Urology

## 2023-10-23 NOTE — Telephone Encounter (Signed)
   Pre-operative Risk Assessment    Patient Name: Luis Pruitt  DOB: 1961-02-26 MRN: [id number]      Request for Surgical Clearance    Procedure:  Left Robot Assistant Laparoscopic  Nephroureterctomy w/ post op intrevestical gemcitabine installation  Date of Surgery:  Clearance 01/09/24                                 Surgeon:  Florencio Hunting Surgeon's Group or Practice Name:  [facility] Phone number:  [phone]  Fax number:  [phone]   Type of Clearance Requested:   - Medical    Type of Anesthesia:  General    Additional requests/questions:  None  Signed, April L Harrington   10/23/2023, 3:54 PM

## 2023-10-23 NOTE — Telephone Encounter (Signed)
 Reaching out to patient to offer assistance regarding upcoming cardiac imaging study; pt verbalizes understanding of appt date/time, parking situation and where to check in, pre-test NPO status and medications ordered, and verified current allergies; name and call back number provided for further questions should they arise Johney Frame RN Navigator Cardiac Imaging [facility] [phone] office [phone] cell

## 2023-10-24 ENCOUNTER — Ambulatory Visit (HOSPITAL_COMMUNITY)
Admission: RE | Admit: 2023-10-24 | Discharge: 2023-10-24 | Discharge status: Home / Self Care | Source: Ambulatory Visit | Attending: Cardiology | Admitting: Cardiology

## 2023-10-24 DIAGNOSIS — R9430 Abnormal result of cardiovascular function study, unspecified: Secondary | ICD-10-CM | POA: Diagnosis not present

## 2023-10-24 DIAGNOSIS — I25118 Atherosclerotic heart disease of native coronary artery with other forms of angina pectoris: Secondary | ICD-10-CM | POA: Insufficient documentation

## 2023-10-24 DIAGNOSIS — I2510 Atherosclerotic heart disease of native coronary artery without angina pectoris: Secondary | ICD-10-CM

## 2023-10-24 MED ORDER — IOHEXOL 350 MG/ML SOLN: 100.0000 mL | Freq: Once | INTRAVENOUS | Status: AC | PRN

## 2023-10-24 MED ORDER — NITROGLYCERIN 0.4 MG SL SUBL
0.8000 mg | SUBLINGUAL_TABLET | Freq: Once | SUBLINGUAL | Status: AC
  Administered 2023-10-24: 0.8 mg via SUBLINGUAL

## 2023-10-25 NOTE — Telephone Encounter (Signed)
 Dr. Mallipeddi  You saw this patient on 10/18/2023 with  follow up CCTA completed on 10/24/2023 with coronary calcium score of 12.7 with minimal plaque distal LM <25%. Per protocol we request that you comment on his cardiac risk to proceed with left robot assisted nephroureterectomy scheduled on 01/09/2024 since it has been less than 2 months since evaluated in the office with no follow up scheduled with you or APP. Please send your comment to P CV Pre-Op Pool.  Thank you, Friddie Jetty DNP, ANP, AACC.

## 2023-10-28 ENCOUNTER — Ambulatory Visit: Payer: Self-pay | Admitting: Internal Medicine

## 2023-10-28 ENCOUNTER — Ambulatory Visit: Admitting: Internal Medicine

## 2023-10-28 NOTE — Telephone Encounter (Signed)
     Primary Cardiologist: Vishnu P Mallipeddi, MD  Chart reviewed as part of pre-operative protocol coverage. Given past medical history and time since last visit, based on ACC/AHA guidelines, Luis Pruitt would be at acceptable risk for the planned procedure without further cardiovascular testing.   Reassuring coronary CTA.  Low risk for upcoming procedures from a cardiac standpoint.  I will route this recommendation to the requesting party via Epic fax function and remove from pre-op pool.  Please call with questions.  [name]. [name] NP-C     10/28/2023, 4:48 PM [facility] [address] Office [phone] Fax [phone]

## 2023-10-28 NOTE — Telephone Encounter (Signed)
-----   Message from Vishnu P Mallipeddi sent at 10/28/2023  9:55 AM EDT ----- Coronary calcium score is 12.7 (34th percentile for age and sex matched control), minimal CAD in the distal left main and total plaque volume is mild.  No significant stenosis in the distal left main  which is reassuring.  No indication to start aspirin.  Continue high intensity statin.  Schedule follow-up as needed. ----- Message ----- From: Interface, Rad Results In Sent: 10/25/2023  12:00 PM EDT To: Vishnu P Mallipeddi, MD

## 2023-10-28 NOTE — Telephone Encounter (Signed)
 The patient has been notified of the result and verbalized understanding.  All questions (if any) were answered. Luis Pruitt, CMA 10/28/2023 4:29 PM

## 2023-11-01 NOTE — Telephone Encounter (Signed)
     Primary Cardiologist: Vishnu P Mallipeddi, MD  Chart reviewed as part of pre-operative protocol coverage. Given past medical history and time since last visit, based on ACC/AHA guidelines, Luis Pruitt would be at acceptable risk for the planned procedure without further cardiovascular testing.   Reassuring coronary CTA.  Low risk for upcoming procedures from a cardiac standpoint.  I will route this recommendation to the requesting party via Epic fax function and remove from pre-op pool.  Please call with questions.  [name]. [name] NP-C     10/28/2023, 4:48 PM [facility] [address] Office [phone] Fax [phone]

## 2023-11-18 NOTE — Progress Notes (Signed)
 COVID Vaccine Completed:  Date of COVID positive in last 90 days:  PCP - Gaither Langton, MD Cardiologist - Diannah Maywood, MD  Cardiac clearance in Epic dated 10-28-23  Chest x-ray - CT chest 09-13-23 Epic EKG - 10-18-23 Epic Stress Test -  ECHO -  Cardiac Cath -  Pacemaker/ICD device last checked: Spinal Cord Stimulator: Coronary CT - 10-24-23 Epic Cardiac CT 04-19-22 Epic  Bowel Prep -   Sleep Study -  CPAP -   Fasting Blood Sugar -  Checks Blood Sugar _____ times a day  Last dose of GLP1 agonist-  N/A GLP1 instructions:  Do not take after     Last dose of SGLT-2 inhibitors-  N/A SGLT-2 instructions:  Do not take after     Blood Thinner Instructions:  Last dose:   Time: Aspirin Instructions: Last Dose:  Activity level:  Can go up a flight of stairs and perform activities of daily living without stopping and without symptoms of chest pain or shortness of breath.  Able to exercise without symptoms  Unable to go up a flight of stairs without symptoms of     Anesthesia review:  Advanced coronary calcifications  Patient denies shortness of breath, fever, cough and chest pain at PAT appointment  Patient verbalized understanding of instructions that were given to them at the PAT appointment. Patient was also instructed that they will need to review over the PAT instructions again at home before surgery.

## 2023-11-18 NOTE — Patient Instructions (Signed)
 SURGICAL WAITING ROOM VISITATION Patients having surgery or a procedure may have no more than 2 support people in the waiting area - these visitors may rotate.    Children under the age of 91 must have an adult with them who is not the patient.  If the patient needs to stay at the hospital during part of their recovery, the visitor guidelines for inpatient rooms apply. Pre-op nurse will coordinate an appropriate time for 1 support person to accompany patient in pre-op.  This support person may not rotate.    Please refer to the [facility] website for the visitor guidelines for Inpatients (after your surgery is over and you are in a regular room).       Your procedure is scheduled on: 11-21-23   Report to [facility] Main Entrance    Report to admitting at 9:15 AM   Call this number if you have problems the morning of surgery [phone]   Follow a clear liquid diet the day before surgery    Do not eat food or drink liquids :After Midnight.           If you have questions, please contact your surgeon's office.   FOLLOW BOWEL PREP AND ANY ADDITIONAL PRE OP INSTRUCTIONS YOU RECEIVED FROM YOUR SURGEON'S OFFICE!!!  Mag Citrate  - drink 8 ounces at noon the day before surgery    Oral Hygiene is also important to reduce your risk of infection.                                    Remember - BRUSH YOUR TEETH THE MORNING OF SURGERY WITH YOUR REGULAR TOOTHPASTE   Do NOT smoke after Midnight   Take these medicines the morning of surgery with A SIP OF WATER :    Tylenol  if needed  Stop all vitamins and herbal supplements 7 days before surgery                              You may not have any metal on your body including  jewelry, and body piercing             Do not wear lotions, powders, cologne, or deodorant              Men may shave face and neck.   Do not bring valuables to the hospital. [facility] IS NOT RESPONSIBLE   FOR VALUABLES.   Contacts, dentures or bridgework  may not be worn into surgery.   Bring small overnight bag day of surgery.   DO NOT BRING YOUR HOME MEDICATIONS TO THE HOSPITAL. PHARMACY WILL DISPENSE MEDICATIONS LISTED ON YOUR MEDICATION LIST TO YOU DURING YOUR ADMISSION IN THE HOSPITAL!    Special Instructions: Bring a copy of your healthcare power of attorney and living will documents the day of surgery if you haven't scanned them before.              Please read over the following fact sheets you were given: IF YOU HAVE QUESTIONS ABOUT YOUR PRE-OP INSTRUCTIONS PLEASE CALL [phone] [facility]  If you received a COVID test during your pre-op visit  it is requested that you wear a mask when out in public, stay away from anyone that may not be feeling well and notify your surgeon if you develop symptoms. If you test positive for Covid or have  been in contact with anyone that has tested positive in the last 10 days please notify you surgeon.  [facility] - Preparing for Surgery Before surgery, you can play an important role.  Because skin is not sterile, your skin needs to be as free of germs as possible.  You can reduce the number of germs on your skin by washing with CHG (chlorahexidine gluconate) soap before surgery.  CHG is an antiseptic cleaner which kills germs and bonds with the skin to continue killing germs even after washing. Please DO NOT use if you have an allergy to CHG or antibacterial soaps.  If your skin becomes reddened/irritated stop using the CHG and inform your nurse when you arrive at Short Stay. Do not shave (including legs and underarms) for at least 48 hours prior to the first CHG shower.  You may shave your face/neck.  Please follow these instructions carefully:  1.  Shower with CHG Soap the night before surgery and the  morning of surgery.  2.  If you choose to wash your hair, wash your hair first as usual with your normal  shampoo.  3.  After you shampoo, rinse your hair and body thoroughly to remove the shampoo.                              4.  Use CHG as you would any other liquid soap.  You can apply chg directly to the skin and wash.  Gently with a scrungie or clean washcloth.  5.  Apply the CHG Soap to your body ONLY FROM THE NECK DOWN.   Do   not use on face/ open                           Wound or open sores. Avoid contact with eyes, ears mouth and   genitals (private parts).                       Wash face,  Genitals (private parts) with your normal soap.             6.  Wash thoroughly, paying special attention to the area where your    surgery  will be performed.  7.  Thoroughly rinse your body with warm water  from the neck down.  8.  DO NOT shower/wash with your normal soap after using and rinsing off the CHG Soap.                9.  Pat yourself dry with a clean towel.            10.  Wear clean pajamas.            11.  Place clean sheets on your bed the night of your first shower and do not  sleep with pets. Day of Surgery : Do not apply any lotions/deodorants the morning of surgery.  Please wear clean clothes to the hospital/surgery center.  FAILURE TO FOLLOW THESE INSTRUCTIONS MAY RESULT IN THE CANCELLATION OF YOUR SURGERY  PATIENT SIGNATURE_________________________________  NURSE SIGNATURE__________________________________  ________________________________________________________________________   WHAT IS A BLOOD TRANSFUSION? Blood Transfusion Information  A transfusion is the replacement of blood or some of its parts. Blood is made up of multiple cells which provide different functions. Red blood cells carry oxygen and are used for blood loss replacement. White blood cells fight against infection. Platelets  control bleeding. Plasma helps clot blood. Other blood products are available for specialized needs, such as hemophilia or other clotting disorders. BEFORE THE TRANSFUSION  Who gives blood for transfusions?  Healthy volunteers who are fully evaluated to make sure their blood is  safe. This is blood bank blood. Transfusion therapy is the safest it has ever been in the practice of medicine. Before blood is taken from a donor, a complete history is taken to make sure that person has no history of diseases nor engages in risky social behavior (examples are intravenous drug use or sexual activity with multiple partners). The donor's travel history is screened to minimize risk of transmitting infections, such as malaria. The donated blood is tested for signs of infectious diseases, such as HIV and hepatitis. The blood is then tested to be sure it is compatible with you in order to minimize the chance of a transfusion reaction. If you or a relative donates blood, this is often done in anticipation of surgery and is not appropriate for emergency situations. It takes many days to process the donated blood. RISKS AND COMPLICATIONS Although transfusion therapy is very safe and saves many lives, the main dangers of transfusion include:  Getting an infectious disease. Developing a transfusion reaction. This is an allergic reaction to something in the blood you were given. Every precaution is taken to prevent this. The decision to have a blood transfusion has been considered carefully by your caregiver before blood is given. Blood is not given unless the benefits outweigh the risks. AFTER THE TRANSFUSION Right after receiving a blood transfusion, you will usually feel much better and more energetic. This is especially true if your red blood cells have gotten low (anemic). The transfusion raises the level of the red blood cells which carry oxygen, and this usually causes an energy increase. The nurse administering the transfusion will monitor you carefully for complications. HOME CARE INSTRUCTIONS  No special instructions are needed after a transfusion. You may find your energy is better. Speak with your caregiver about any limitations on activity for underlying diseases you may have. SEEK  MEDICAL CARE IF:  Your condition is not improving after your transfusion. You develop redness or irritation at the intravenous (IV) site. SEEK IMMEDIATE MEDICAL CARE IF:  Any of the following symptoms occur over the next 12 hours: Shaking chills. You have a temperature by mouth above 102 F (38.9 C), not controlled by medicine. Chest, back, or muscle pain. People around you feel you are not acting correctly or are confused. Shortness of breath or difficulty breathing. Dizziness and fainting. You get a rash or develop hives. You have a decrease in urine output. Your urine turns a dark color or changes to pink, red, or brown. Any of the following symptoms occur over the next 10 days: You have a temperature by mouth above 102 F (38.9 C), not controlled by medicine. Shortness of breath. Weakness after normal activity. The white part of the eye turns yellow (jaundice). You have a decrease in the amount of urine or are urinating less often. Your urine turns a dark color or changes to pink, red, or brown. Document Released: 04/20/2000 Document Revised: 07/16/2011 Document Reviewed: 12/08/2007 [facility] Patient Information 2014 [place], [place].  _______________________________________________________________________

## 2023-11-19 ENCOUNTER — Encounter (HOSPITAL_COMMUNITY)
Admission: RE | Admit: 2023-11-19 | Discharge: 2023-11-19 | Disposition: A | Discharge status: Home / Self Care | Source: Ambulatory Visit | Attending: Urology | Admitting: Urology

## 2023-11-19 ENCOUNTER — Other Ambulatory Visit: Payer: Self-pay

## 2023-11-19 ENCOUNTER — Encounter (HOSPITAL_COMMUNITY): Payer: Self-pay

## 2023-11-19 DIAGNOSIS — E785 Hyperlipidemia, unspecified: Secondary | ICD-10-CM | POA: Diagnosis not present

## 2023-11-19 DIAGNOSIS — F17200 Nicotine dependence, unspecified, uncomplicated: Secondary | ICD-10-CM | POA: Diagnosis not present

## 2023-11-19 DIAGNOSIS — I10 Essential (primary) hypertension: Secondary | ICD-10-CM | POA: Diagnosis not present

## 2023-11-19 DIAGNOSIS — C662 Malignant neoplasm of left ureter: Secondary | ICD-10-CM | POA: Diagnosis not present

## 2023-11-19 DIAGNOSIS — Z01812 Encounter for preprocedural laboratory examination: Secondary | ICD-10-CM | POA: Insufficient documentation

## 2023-11-19 DIAGNOSIS — I2510 Atherosclerotic heart disease of native coronary artery without angina pectoris: Secondary | ICD-10-CM | POA: Diagnosis not present

## 2023-11-19 DIAGNOSIS — Z87891 Personal history of nicotine dependence: Secondary | ICD-10-CM | POA: Insufficient documentation

## 2023-11-19 DIAGNOSIS — D0919 Carcinoma in situ of other urinary organs: Secondary | ICD-10-CM | POA: Diagnosis not present

## 2023-11-19 DIAGNOSIS — R3121 Asymptomatic microscopic hematuria: Secondary | ICD-10-CM | POA: Diagnosis not present

## 2023-11-19 HISTORY — DX: Malignant neoplasm of urinary organ, unspecified: C68.9

## 2023-11-19 LAB — BASIC METABOLIC PANEL WITH GFR
Anion gap: 9 (ref 5–15)
BUN: 12 mg/dL (ref 8–23)
CO2: 24 mmol/L (ref 22–32)
Calcium: 9.3 mg/dL (ref 8.9–10.3)
Chloride: 105 mmol/L (ref 98–111)
Creatinine, Ser: 1.04 mg/dL (ref 0.61–1.24)
GFR, Estimated: >60 mL/min (ref >60)
Glucose, Bld: 106 mg/dL — ABNORMAL HIGH (ref 70–99)
Potassium: 4.4 mmol/L (ref 3.5–5.1)
Sodium: 138 mmol/L (ref 135–145)

## 2023-11-19 LAB — CBC
HCT: 40.9 % (ref 39.0–52.0)
Hemoglobin: 13.7 g/dL (ref 13.0–17.0)
MCH: 32.1 pg (ref 26.0–34.0)
MCHC: 33.5 g/dL (ref 30.0–36.0)
MCV: 95.8 fL (ref 80.0–100.0)
Platelets: 242 K/uL (ref 150–400)
RBC: 4.27 MIL/uL (ref 4.22–5.81)
RDW: 12.5 % (ref 11.5–15.5)
WBC: 5.8 K/uL (ref 4.0–10.5)
nRBC: 0 % (ref 0.0–0.2)

## 2023-11-20 NOTE — H&P (Signed)
 Office Visit Report     10/22/2023   --------------------------------------------------------------------------------   Luis Pruitt  MRN: [id number]  DOB: 03-29-1961, 63 year old Male  SSN:    PRIMARY CARE:  Gaither KIDD. Sheryle MOULD, MD  PRIMARY CARE FAX:  [phone]  REFERRING:    PROVIDER:  Gretel Ferrara, M.D.  LOCATION:  [facility] [phone]     --------------------------------------------------------------------------------   CC/HPI: Urothelial carcinoma of the left ureter   Luis Pruitt returns today after undergoing cystoscopy with left ureteroscopy and biopsy of his left ureteral tumor about 2 weeks ago. He overall, he has done well and is tolerating his ureteral stent with minimal discomfort and urinary symptoms. Intraoperatively, his tumor was in the mid/proximal ureter and was fairly concentric. It was difficult to obtain adequate biopsy tissue as it did not appear to be a straightforward papillary tumor. He follows up today to review his pathology results. His biopsy which was somewhat scant indicated low-grade urothelial carcinoma. However, his cytology was concerning for high-grade cells. He has had a CT of the abdomen and pelvis which do not indicate evidence of lymphadenopathy, locally advanced disease, or metastatic disease. He also had a low-dose CT scan of the chest performed earlier in the spring due to his history of smoking that was negative for pulmonary nodules. However, this did detect some coronary artery calcifications and he apparently is scheduled for what sounds like a calcium  score CT scan of the heart and subsequent cardiac evaluation with Dr. Mallipeddi. Fortunately, he has been able to quit smoking since his initial consultation 1 month ago.     ALLERGIES: No Allergies    MEDICATIONS: oxyBUTYnin Chloride ER 5 MG Tablet Extended Release 24 Hour 1 tablet PO Daily  Tamsulosin HCl 0.4 MG Capsule 1 capsule PO Daily Take 30 minutes after evening  meal  Rosuvastatin  Calcium      GU PSH: No GU PSH    NON-GU PSH: Appendectomy     GU PMH: Left ureteral cancer - 09/18/2023    NON-GU PMH: No Non-GU PMH    FAMILY HISTORY: 2 daughters - Runs in Family   SOCIAL HISTORY: Marital Status: Married Preferred Language: English; Ethnicity: Not Hispanic Or Latino Current Smoking Status: Patient smokes occasionally.   Tobacco Use Assessment Completed: Used Tobacco in last 30 days? Drinks 4+ caffeinated drinks per day.    REVIEW OF SYSTEMS:    GU Review Male:   Patient denies frequent urination, hard to postpone urination, burning/ pain with urination, get up at night to urinate, leakage of urine, stream starts and stops, trouble starting your streams, and have to strain to urinate .  Gastrointestinal (Upper):   Patient denies nausea and vomiting.  Gastrointestinal (Lower):   Patient denies diarrhea and constipation.  Constitutional:   Patient denies fever, night sweats, weight loss, and fatigue.  Skin:   Patient denies skin rash/ lesion and itching.  Eyes:   Patient denies blurred vision and double vision.  Ears/ Nose/ Throat:   Patient denies sore throat and sinus problems.  Hematologic/Lymphatic:   Patient denies swollen glands and easy bruising.  Cardiovascular:   Patient denies leg swelling and chest pains.  Respiratory:   Patient denies cough and shortness of breath.  Endocrine:   Patient denies excessive thirst.  Musculoskeletal:   Patient denies joint pain and back pain.  Neurological:   Patient denies headaches and dizziness.  Psychologic:   Patient denies depression and anxiety.   VITAL SIGNS: None  MULTI-SYSTEM PHYSICAL EXAMINATION:    Constitutional: Well-nourished. No physical deformities. Normally developed. Good grooming.     Complexity of Data:  Records Review:   Pathology Reports, Previous Patient Records  X-Ray Review: C.T. Chest: Reviewed Films.  C.T. Abdomen/Pelvis: Reviewed Films.     PROCEDURES:           Urinalysis w/Scope Dipstick Dipstick Cont'd Micro  Color: Yellow Bilirubin: Neg mg/dL WBC/hpf: 0 - 5/hpf  Appearance: Slightly Cloudy Ketones: Neg mg/dL RBC/hpf: 40 - 39/yeq  Specific Gravity: 1.025 Blood: 3+ ery/uL Bacteria: Few (10-25/hpf)  pH: 7.5 Protein: 3+ mg/dL Cystals: NS (Not Seen)  Glucose: Neg mg/dL Urobilinogen: 0.2 mg/dL Casts: NS (Not Seen)    Nitrites: Neg Trichomonas: Not Present    Leukocyte Esterase: 1+ leu/uL Mucous: Not Present      Epithelial Cells: NS (Not Seen)      Yeast: NS (Not Seen)      Sperm: Not Present    ASSESSMENT:      ICD-10 Details  1 GU:   Left ureteral cancer - C66.2    PLAN:           Schedule Return Visit/Planned Activity: Other See Visit Notes             Note: Will call to schedule surgery.          Document Letter(s):  Created for Patient: Clinical Summary         Notes:   1. Urothelial carcinoma of the left ureter: We discussed some of the conflicting information from his endoscopic evaluation including his biopsy which suggested low-grade tumor but a cytology that was suspicious for high-grade disease. Considering the concentric appearance of this tumor, it is at least a higher volume low-grade tumor and does have features that might suggest more of a high-grade tumor. We have reviewed options including proceeding with a repeat endoscopic evaluation to obtain additional diagnostic tissue information. However, we also discussed the option of proceeding with definitive therapy. Unfortunately, due to the location of his tumor, he would require a robot-assisted laparoscopic nephroureterectomy. He does have normal baseline renal function with a creatinine of 0.9 last month.   We have discussed the risks of treatment in detail including but not limited to bleeding, infection, heart attack, stroke, death, venothromoboembolism, cancer recurrence, injury/damage to surrounding organs and structures, urine leak, the possibility of open surgical  conversion for patients undergoing minimally invasive surgery, the risk of developing chronic kidney disease and its associated implications, and the potential risk of end stage renal disease possibly necessitating dialysis.   At this time, he is inclined to proceed with definitive treatment with a nephroureterectomy. We have therefore reviewed that procedure in detail including the potential risks and the expected recovery process as well as the need for long-term surveillance including cystoscopic surveillance of his bladder. He will be tentatively scheduled for a left robot-assisted laparoscopic nephroureterectomy with plans for postoperative intravesical gemcitabine  instillation. He is getting ready to undergo a cardiac evaluation which I think it would be wise for him to proceed with in anticipation of a major noncardiac surgery. I will plan to have him tentatively follow-up with me preoperatively 1 to 2 weeks before his surgery to reevaluate his cardiac evaluation and ensure that proceeding with surgery would be appropriate.   CC: Dr. Gaither Langton  Dr. Diannah Mallipeddi      E & M CODES: We spent 44 minutes dedicated to evaluation and management time, including face to face interaction, discussions on  coordination of care, documentation, result review, and discussion with others as applicable.     * Signed by Gretel Ferrara, M.D. on 10/22/23 at 9:42 PM (EDT)*

## 2023-11-20 NOTE — Progress Notes (Signed)
 Anesthesia Chart Review   Case: [id number] Date/Time: 11/21/23 1115   Procedures:      NEPHROURETERECTOMY, ROBOT-ASSISTED, LAPAROSCOPIC (Left)     INSTILLATION, BLADDER - will instill Gemcitabine  in PACU   Anesthesia type: General   Diagnosis: Urothelial carcinoma of left distal ureter (HCC) [C66.2]   Pre-op diagnosis: UROTHELIAL CARCINOMA OF THE LEFT URETER   Location: [facility]OM 03 / [facility]   Surgeons: Renda Glance, MD       DISCUSSION:63 y.o. former smoker with h/o urothelial carcinoma of the left ureter scheduled for above procedure 11/21/2023 with Dr. Glance Renda.   Pt last seen by cardiology 10/18/2023. Referred for evaluation of advanced coronary artery calcification on CT chest. Pt walks 2-3 miles and plays golf most days of the week. Denies angina or DOE. CT cardiac ordered with nonobstructive CAD. Pt advised to follow up with cardio prn.   S/p cystoscopy with stent placement 10/07/2023. No anesthesia complications noted.  VS: BP 135/70   Pulse (!) 54   Temp 36.6 C (Oral)   Resp 16   Ht 6' 1 (1.854 m)   Wt 92.2 kg   SpO2 98%   BMI 26.81 kg/m   PROVIDERS: Sheryle Carwin, MD is PCP   Cardiologist - Diannah Maywood, MD   LABS: Labs reviewed: Acceptable for surgery. (all labs ordered are listed, but only abnormal results are displayed)  Labs Reviewed  BASIC METABOLIC PANEL WITH GFR - Abnormal; Notable for the following components:      Result Value   Glucose, Bld 106 (*)    All other components within normal limits  CBC  TYPE AND SCREEN     IMAGES:   EKG:   CV: CT Coronary 10/24/2023 IMPRESSION: 1. Minimal CAD in distal LM, <25% stenosis, CADRADS 1.   2. Total plaque volume 23 mm3 (calcified plaque 3 mm3; non-calcified plaque 20 mm3), which is 9th percentile for age- and sex-matched controls. TPV is mild.   3. Coronary calcium  score is 12.7, which places the patient in the 34th percentile for age and sex matched control.   4. Normal coronary origins  with right dominance.   RECOMMENDATIONS: CAD-RADS 1. Minimal non-obstructive CAD (0-24%). Consider non-atherosclerotic causes of chest pain. Consider preventive therapy and risk factor modification. Past Medical History:  Diagnosis Date   Hyperlipidemia    Urothelial carcinoma [facility])     Past Surgical History:  Procedure Laterality Date   APPENDECTOMY     COLONOSCOPY  02/28/2012   Procedure: COLONOSCOPY;  Surgeon: Claudis RAYMOND Rivet, MD;  [facility];  Service: Endoscopy;  Laterality: N/A;  830   COLONOSCOPY N/A 05/03/2017   Procedure: COLONOSCOPY;  Surgeon: Rivet Claudis RAYMOND, MD;  [facility];  Service: Endoscopy;  Laterality: N/A;  200   CYSTOSCOPY W/ RETROGRADES Left 10/07/2023   Procedure: CYSTOSCOPY, WITH RETROGRADE PYELOGRAM WITH STENT PLACEMENT;  Surgeon: Renda Glance, MD;  [facility];  Service: Urology;  Laterality: Left;   CYSTOSCOPY WITH URETEROSCOPY AND STENT PLACEMENT Left 10/07/2023   Procedure: URETEROSCOPY WITH BIOPSY, LASER ABLATION OF LEFT URETERAL TUMOR, WITH STENT INSERTION;  Surgeon: Renda Glance, MD;  [facility];  Service: Urology;  Laterality: Left;   EXPLORATORY LAPAROTOMY     with appendectomy    MEDICATIONS:  acetaminophen  (TYLENOL ) 500 MG tablet   rosuvastatin  (CRESTOR ) 10 MG tablet   No current facility-administered medications for this encounter.    Harlene Hoots Ward, PA-C [facility] [phone]

## 2023-11-21 ENCOUNTER — Inpatient Hospital Stay (HOSPITAL_COMMUNITY)
Admission: RE | Admit: 2023-11-21 | Discharge: 2023-11-22 | DRG: 655 | Disposition: A | Discharge status: Home / Self Care | Attending: Urology | Admitting: Urology

## 2023-11-21 ENCOUNTER — Encounter (HOSPITAL_COMMUNITY): Payer: Self-pay | Admitting: Urology

## 2023-11-21 ENCOUNTER — Other Ambulatory Visit: Payer: Self-pay

## 2023-11-21 ENCOUNTER — Inpatient Hospital Stay (HOSPITAL_COMMUNITY): Payer: Self-pay | Admitting: Medical

## 2023-11-21 ENCOUNTER — Inpatient Hospital Stay (HOSPITAL_COMMUNITY): Admitting: Anesthesiology

## 2023-11-21 ENCOUNTER — Encounter (HOSPITAL_COMMUNITY)
Admission: RE | Disposition: A | Discharge status: Home / Self Care | Payer: Self-pay | Source: Home / Self Care | Attending: Urology

## 2023-11-21 DIAGNOSIS — I10 Essential (primary) hypertension: Secondary | ICD-10-CM | POA: Diagnosis present

## 2023-11-21 DIAGNOSIS — E785 Hyperlipidemia, unspecified: Secondary | ICD-10-CM | POA: Diagnosis present

## 2023-11-21 DIAGNOSIS — F17200 Nicotine dependence, unspecified, uncomplicated: Secondary | ICD-10-CM | POA: Diagnosis present

## 2023-11-21 DIAGNOSIS — I2510 Atherosclerotic heart disease of native coronary artery without angina pectoris: Secondary | ICD-10-CM | POA: Diagnosis present

## 2023-11-21 DIAGNOSIS — C662 Malignant neoplasm of left ureter: Secondary | ICD-10-CM | POA: Diagnosis present

## 2023-11-21 DIAGNOSIS — D4959 Neoplasm of unspecified behavior of other genitourinary organ: Principal | ICD-10-CM | POA: Diagnosis present

## 2023-11-21 HISTORY — PX: ROBOT ASSITED LAPAROSCOPIC NEPHROURETERECTOMY: SHX6077

## 2023-11-21 HISTORY — PX: BLADDER INSTILLATION: SHX6893

## 2023-11-21 LAB — TYPE AND SCREEN
ABO/RH(D): O POS
Antibody Screen: NEGATIVE

## 2023-11-21 LAB — HEMOGLOBIN AND HEMATOCRIT, BLOOD
HCT: 42.4 % (ref 39.0–52.0)
Hemoglobin: 13.8 g/dL (ref 13.0–17.0)

## 2023-11-21 LAB — ABO/RH: ABO/RH(D): O POS

## 2023-11-21 SURGERY — NEPHROURETERECTOMY, ROBOT-ASSISTED, LAPAROSCOPIC
Anesthesia: General

## 2023-11-21 MED ORDER — PROPOFOL 10 MG/ML IV BOLUS
INTRAVENOUS | Status: AC
  Filled 2023-11-21: qty 20

## 2023-11-21 MED ORDER — SODIUM CHLORIDE 0.45 % IV SOLN: INTRAVENOUS | Status: DC

## 2023-11-21 MED ORDER — MEPERIDINE HCL 25 MG/ML IJ SOLN: 6.2500 mg | INTRAMUSCULAR | Status: DC | PRN

## 2023-11-21 MED ORDER — KETAMINE HCL 50 MG/5ML IJ SOSY
PREFILLED_SYRINGE | INTRAMUSCULAR | Status: DC | PRN
  Administered 2023-11-21: 10 mg via INTRAVENOUS
  Administered 2023-11-21: 30 mg via INTRAVENOUS
  Administered 2023-11-21: 10 mg via INTRAVENOUS

## 2023-11-21 MED ORDER — DEXMEDETOMIDINE HCL IN NACL 200 MCG/50ML IV SOLN
INTRAVENOUS | Status: DC | PRN
  Administered 2023-11-21 (×4): 4 ug via INTRAVENOUS

## 2023-11-21 MED ORDER — FENTANYL CITRATE (PF) 100 MCG/2ML IJ SOLN
INTRAMUSCULAR | Status: AC
  Filled 2023-11-21: qty 2

## 2023-11-21 MED ORDER — SODIUM CHLORIDE (PF) 0.9 % IJ SOLN
INTRAMUSCULAR | Status: DC | PRN
Start: 2023-11-21 — End: 2023-11-21
  Administered 2023-11-21: 20 mL

## 2023-11-21 MED ORDER — DEXAMETHASONE SODIUM PHOSPHATE 10 MG/ML IJ SOLN
INTRAMUSCULAR | Status: DC | PRN
  Administered 2023-11-21: 8 mg via INTRAVENOUS

## 2023-11-21 MED ORDER — FENTANYL CITRATE (PF) 100 MCG/2ML IJ SOLN
INTRAMUSCULAR | Status: DC | PRN
  Administered 2023-11-21 (×6): 50 ug via INTRAVENOUS

## 2023-11-21 MED ORDER — DEXMEDETOMIDINE HCL IN NACL 80 MCG/20ML IV SOLN
INTRAVENOUS | Status: AC
  Filled 2023-11-21: qty 20

## 2023-11-21 MED ORDER — CEFAZOLIN SODIUM 1 G IJ SOLR
INTRAMUSCULAR | Status: AC
  Filled 2023-11-21: qty 20

## 2023-11-21 MED ORDER — SODIUM CHLORIDE (PF) 0.9 % IJ SOLN
INTRAMUSCULAR | Status: AC
  Filled 2023-11-21: qty 20

## 2023-11-21 MED ORDER — PHENYLEPHRINE HCL (PRESSORS) 10 MG/ML IV SOLN
INTRAVENOUS | Status: AC
  Filled 2023-11-21: qty 1

## 2023-11-21 MED ORDER — ROCURONIUM BROMIDE 10 MG/ML (PF) SYRINGE
PREFILLED_SYRINGE | INTRAVENOUS | Status: AC
  Filled 2023-11-21: qty 10

## 2023-11-21 MED ORDER — ONDANSETRON HCL 4 MG/2ML IJ SOLN
INTRAMUSCULAR | Status: AC
Start: 2023-11-21 — End: 2023-11-21
  Filled 2023-11-21: qty 10

## 2023-11-21 MED ORDER — LACTATED RINGERS IV SOLN: INTRAVENOUS | Status: DC | PRN

## 2023-11-21 MED ORDER — ONDANSETRON HCL 4 MG/2ML IJ SOLN
INTRAMUSCULAR | Status: DC | PRN
Start: 2023-11-21 — End: 2023-11-21
  Administered 2023-11-21: 4 mg via INTRAVENOUS

## 2023-11-21 MED ORDER — SULFAMETHOXAZOLE-TRIMETHOPRIM 800-160 MG PO TABS: 1.0000 | ORAL_TABLET | Freq: Two times a day (BID) | ORAL | 0 refills | Status: AC

## 2023-11-21 MED ORDER — ORAL CARE MOUTH RINSE: 15.0000 mL | Freq: Once | OROMUCOSAL | Status: AC

## 2023-11-21 MED ORDER — OXYCODONE HCL 5 MG PO TABS: 5.0000 mg | ORAL_TABLET | Freq: Once | ORAL | Status: DC | PRN

## 2023-11-21 MED ORDER — KETAMINE HCL 50 MG/5ML IJ SOSY
PREFILLED_SYRINGE | INTRAMUSCULAR | Status: AC
  Filled 2023-11-21: qty 5

## 2023-11-21 MED ORDER — GEMCITABINE CHEMO FOR BLADDER INSTILLATION 2000 MG
2000.0000 mg | Freq: Once | INTRAVENOUS | Status: AC
  Administered 2023-11-21: 2000 mg via INTRAVESICAL
  Filled 2023-11-21: qty 2000

## 2023-11-21 MED ORDER — DROPERIDOL 2.5 MG/ML IJ SOLN: 0.6250 mg | Freq: Once | INTRAMUSCULAR | Status: DC | PRN

## 2023-11-21 MED ORDER — ACETAMINOPHEN 500 MG PO TABS
1000.0000 mg | ORAL_TABLET | Freq: Four times a day (QID) | ORAL | Status: AC
  Administered 2023-11-21 – 2023-11-22 (×4): 1000 mg via ORAL
  Filled 2023-11-21 (×4): qty 2

## 2023-11-21 MED ORDER — DIPHENHYDRAMINE HCL 50 MG/ML IJ SOLN: 12.5000 mg | Freq: Four times a day (QID) | INTRAMUSCULAR | Status: DC | PRN

## 2023-11-21 MED ORDER — TRAMADOL HCL 50 MG PO TABS
50.0000 mg | ORAL_TABLET | Freq: Four times a day (QID) | ORAL | Status: DC | PRN
  Administered 2023-11-22 (×2): 100 mg via ORAL
  Filled 2023-11-21 (×2): qty 2

## 2023-11-21 MED ORDER — DIPHENHYDRAMINE HCL 12.5 MG/5ML PO ELIX: 12.5000 mg | ORAL_SOLUTION | Freq: Four times a day (QID) | ORAL | Status: DC | PRN

## 2023-11-21 MED ORDER — BUPIVACAINE LIPOSOME 1.3 % IJ SUSP
INTRAMUSCULAR | Status: DC | PRN
  Administered 2023-11-21: 20 mL

## 2023-11-21 MED ORDER — PHENYLEPHRINE 80 MCG/ML (10ML) SYRINGE FOR IV PUSH (FOR BLOOD PRESSURE SUPPORT)
PREFILLED_SYRINGE | INTRAVENOUS | Status: AC
  Filled 2023-11-21: qty 10

## 2023-11-21 MED ORDER — HYOSCYAMINE SULFATE 0.125 MG SL SUBL: 0.1250 mg | SUBLINGUAL_TABLET | SUBLINGUAL | Status: DC | PRN

## 2023-11-21 MED ORDER — HYDROMORPHONE HCL 1 MG/ML IJ SOLN
0.5000 mg | INTRAMUSCULAR | Status: DC | PRN
  Administered 2023-11-21 – 2023-11-22 (×5): 1 mg via INTRAVENOUS
  Filled 2023-11-21 (×5): qty 1

## 2023-11-21 MED ORDER — LIDOCAINE HCL (PF) 2 % IJ SOLN
INTRAMUSCULAR | Status: AC
  Filled 2023-11-21: qty 5

## 2023-11-21 MED ORDER — MAGNESIUM CITRATE PO SOLN: 1.0000 | Freq: Once | ORAL | Status: DC

## 2023-11-21 MED ORDER — CHLORHEXIDINE GLUCONATE 0.12 % MT SOLN
15.0000 mL | Freq: Once | OROMUCOSAL | Status: AC
Start: 2023-11-21 — End: 2023-11-21
  Administered 2023-11-21: 15 mL via OROMUCOSAL

## 2023-11-21 MED ORDER — MIDAZOLAM HCL 2 MG/2ML IJ SOLN
INTRAMUSCULAR | Status: AC
  Filled 2023-11-21: qty 2

## 2023-11-21 MED ORDER — OXYCODONE HCL 5 MG/5ML PO SOLN: 5.0000 mg | Freq: Once | ORAL | Status: DC | PRN

## 2023-11-21 MED ORDER — SUGAMMADEX SODIUM 200 MG/2ML IV SOLN
INTRAVENOUS | Status: DC | PRN
  Administered 2023-11-21: 360 mg via INTRAVENOUS

## 2023-11-21 MED ORDER — BUPIVACAINE-EPINEPHRINE (PF) 0.25% -1:200000 IJ SOLN
INTRAMUSCULAR | Status: AC
  Filled 2023-11-21: qty 30

## 2023-11-21 MED ORDER — LACTATED RINGERS IV SOLN: INTRAVENOUS | Status: DC

## 2023-11-21 MED ORDER — TRAMADOL HCL 50 MG PO TABS: 50.0000 mg | ORAL_TABLET | Freq: Four times a day (QID) | ORAL | 0 refills | Status: AC | PRN

## 2023-11-21 MED ORDER — DOCUSATE SODIUM 100 MG PO CAPS
100.0000 mg | ORAL_CAPSULE | Freq: Two times a day (BID) | ORAL | Status: DC
  Administered 2023-11-21 – 2023-11-22 (×2): 100 mg via ORAL
  Filled 2023-11-21 (×2): qty 1

## 2023-11-21 MED ORDER — MIDAZOLAM HCL 2 MG/2ML IJ SOLN
INTRAMUSCULAR | Status: DC | PRN
  Administered 2023-11-21: 2 mg via INTRAVENOUS

## 2023-11-21 MED ORDER — SODIUM CHLORIDE 0.9 % IR SOLN
Status: DC | PRN
  Administered 2023-11-21: 1000 mL via INTRAVESICAL

## 2023-11-21 MED ORDER — ACETAMINOPHEN 10 MG/ML IV SOLN: 1000.0000 mg | Freq: Once | INTRAVENOUS | Status: DC | PRN

## 2023-11-21 MED ORDER — LIDOCAINE HCL (PF) 2 % IJ SOLN
INTRAMUSCULAR | Status: DC | PRN
  Administered 2023-11-21: 50 mg via INTRADERMAL

## 2023-11-21 MED ORDER — HYDROMORPHONE HCL 1 MG/ML IJ SOLN
0.2500 mg | INTRAMUSCULAR | Status: DC | PRN
  Administered 2023-11-21 (×2): 0.5 mg via INTRAVENOUS

## 2023-11-21 MED ORDER — ROCURONIUM BROMIDE 10 MG/ML (PF) SYRINGE
PREFILLED_SYRINGE | INTRAVENOUS | Status: DC | PRN
  Administered 2023-11-21: 50 mg via INTRAVENOUS
  Administered 2023-11-21: 20 mg via INTRAVENOUS
  Administered 2023-11-21: 30 mg via INTRAVENOUS
  Administered 2023-11-21 (×2): 20 mg via INTRAVENOUS

## 2023-11-21 MED ORDER — ROSUVASTATIN CALCIUM 10 MG PO TABS
10.0000 mg | ORAL_TABLET | Freq: Every day | ORAL | Status: DC
  Administered 2023-11-21: 10 mg via ORAL
  Filled 2023-11-21: qty 1

## 2023-11-21 MED ORDER — CEFAZOLIN SODIUM-DEXTROSE 2-4 GM/100ML-% IV SOLN
2.0000 g | INTRAVENOUS | Status: AC
  Administered 2023-11-21 (×2): 2 g via INTRAVENOUS
  Filled 2023-11-21: qty 100

## 2023-11-21 MED ORDER — HYDROMORPHONE HCL 1 MG/ML IJ SOLN
INTRAMUSCULAR | Status: AC
Start: 2023-11-21 — End: 2023-11-21
  Filled 2023-11-21: qty 1

## 2023-11-21 MED ORDER — PROPOFOL 500 MG/50ML IV EMUL
INTRAVENOUS | Status: DC | PRN
  Administered 2023-11-21: 150 mg via INTRAVENOUS

## 2023-11-21 MED ORDER — DEXAMETHASONE SODIUM PHOSPHATE 10 MG/ML IJ SOLN
INTRAMUSCULAR | Status: AC
  Filled 2023-11-21: qty 1

## 2023-11-21 MED ORDER — TRIPLE ANTIBIOTIC 3.5-400-5000 EX OINT: 1.0000 | TOPICAL_OINTMENT | Freq: Three times a day (TID) | CUTANEOUS | Status: DC | PRN

## 2023-11-21 MED ORDER — STERILE WATER FOR IRRIGATION IR SOLN
Status: DC | PRN
  Administered 2023-11-21: 1000 mL

## 2023-11-21 MED ORDER — ONDANSETRON HCL 4 MG/2ML IJ SOLN: 4.0000 mg | INTRAMUSCULAR | Status: DC | PRN

## 2023-11-21 MED ORDER — BUPIVACAINE LIPOSOME 1.3 % IJ SUSP
INTRAMUSCULAR | Status: AC
  Filled 2023-11-21: qty 20

## 2023-11-21 MED ORDER — DOCUSATE SODIUM 100 MG PO CAPS: 100.0000 mg | ORAL_CAPSULE | Freq: Two times a day (BID) | ORAL | Status: AC

## 2023-11-21 SURGICAL SUPPLY — 58 items
BAG COUNTER SPONGE SURGICOUNT (BAG) IMPLANT
BAG LAPAROSCOPIC 12 15 PORT 16 (BASKET) ×2 IMPLANT
CATH FOLEY 3WAY 5CC 18FR (CATHETERS) ×2 IMPLANT
CHLORAPREP W/TINT 26 (MISCELLANEOUS) ×2 IMPLANT
CLIP LIGATING HEM O LOK PURPLE (MISCELLANEOUS) ×2 IMPLANT
CLIP LIGATING HEMO LOK XL GOLD (MISCELLANEOUS) ×2 IMPLANT
CLIP LIGATING HEMO O LOK GREEN (MISCELLANEOUS) ×2 IMPLANT
COVER SURGICAL LIGHT HANDLE (MISCELLANEOUS) ×2 IMPLANT
COVER TIP SHEARS 8 DVNC (MISCELLANEOUS) ×2 IMPLANT
CUTTER ECHEON FLEX ENDO 45 340 (ENDOMECHANICALS) IMPLANT
DERMABOND ADVANCED .7 DNX12 (GAUZE/BANDAGES/DRESSINGS) ×2 IMPLANT
DRAIN CHANNEL 15F RND FF 3/16 (WOUND CARE) ×2 IMPLANT
DRAPE ARM DVNC X/XI (DISPOSABLE) ×8 IMPLANT
DRAPE COLUMN DVNC XI (DISPOSABLE) ×2 IMPLANT
DRAPE INCISE IOBAN 66X45 STRL (DRAPES) ×2 IMPLANT
DRAPE LAPAROSCOPIC ABDOMINAL (DRAPES) ×2 IMPLANT
DRAPE SHEET LG 3/4 BI-LAMINATE (DRAPES) ×2 IMPLANT
DRIVER NDL LRG 8 DVNC XI (INSTRUMENTS) ×4 IMPLANT
DRIVER NDLE LRG 8 DVNC XI (INSTRUMENTS) ×4 IMPLANT
DRSG TEGADERM 4X4.75 (GAUZE/BANDAGES/DRESSINGS) ×2 IMPLANT
ELECT PENCIL ROCKER SW 15FT (MISCELLANEOUS) ×2 IMPLANT
ELECT REM PT RETURN 15FT ADLT (MISCELLANEOUS) ×2 IMPLANT
EVACUATOR SILICONE 100CC (DRAIN) ×2 IMPLANT
FORCEPS BPLR 8 MD DVNC XI (FORCEP) ×2 IMPLANT
FORCEPS PROGRASP DVNC XI (FORCEP) ×2 IMPLANT
GAUZE SPONGE 2X2 8PLY STRL LF (GAUZE/BANDAGES/DRESSINGS) ×2 IMPLANT
GLOVE BIO SURGEON STRL SZ 6.5 (GLOVE) ×2 IMPLANT
GLOVE SURG LX STRL 7.5 STRW (GLOVE) ×4 IMPLANT
GOWN SRG XL LVL 4 BRTHBL STRL (GOWNS) ×2 IMPLANT
GOWN STRL REUS W/ TWL XL LVL3 (GOWN DISPOSABLE) ×4 IMPLANT
HEMOSTAT SURGICEL 4X8 (HEMOSTASIS) IMPLANT
IRRIGATION SUCT STRKRFLW 2 WTP (MISCELLANEOUS) ×2 IMPLANT
KIT BASIN OR (CUSTOM PROCEDURE TRAY) ×2 IMPLANT
KIT TURNOVER KIT A (KITS) ×2 IMPLANT
NS IRRIG 1000ML POUR BTL (IV SOLUTION) ×2 IMPLANT
PLUG CATH AND CAP STRL 200 (CATHETERS) ×2 IMPLANT
PROTECTOR NERVE ULNAR (MISCELLANEOUS) ×4 IMPLANT
RELOAD STAPLE 45 2.6 WHT THIN (STAPLE) IMPLANT
SCISSORS MNPLR CVD DVNC XI (INSTRUMENTS) ×2 IMPLANT
SEAL UNIV 5-12 XI (MISCELLANEOUS) ×8 IMPLANT
SET IRRIG Y TYPE TUR BLADDER L (SET/KITS/TRAYS/PACK) IMPLANT
SET TUBE SMOKE EVAC HIGH FLOW (TUBING) ×2 IMPLANT
SOLUTION ELECTROSURG ANTI STCK (MISCELLANEOUS) ×2 IMPLANT
SPIKE FLUID TRANSFER (MISCELLANEOUS) ×2 IMPLANT
SUT ETHILON 3 0 PS 1 (SUTURE) ×2 IMPLANT
SUT MNCRL AB 4-0 PS2 18 (SUTURE) ×4 IMPLANT
SUT PDS AB 1 CT1 27 (SUTURE) IMPLANT
SUT VIC AB 0 CT1 27XBRD ANTBC (SUTURE) IMPLANT
SUT VIC AB 2-0 SH 27X BRD (SUTURE) IMPLANT
SUT VICRYL 0 UR6 27IN ABS (SUTURE) IMPLANT
SUTURE V-LC BRB 180 2/0GR6GS22 (SUTURE) IMPLANT
SUTURE VLOC BRB 180 ABS3/0GR12 (SUTURE) ×2 IMPLANT
SYR 30ML LL (SYRINGE) ×2 IMPLANT
TRAY FOLEY MTR SLVR 16FR STAT (SET/KITS/TRAYS/PACK) ×2 IMPLANT
TRAY LAPAROSCOPIC (CUSTOM PROCEDURE TRAY) ×2 IMPLANT
TROCAR ADV FIXATION 12X100MM (TROCAR) ×2 IMPLANT
TROCAR Z THREAD OPTICAL 12X100 (TROCAR) ×2 IMPLANT
WATER STERILE IRR 1000ML POUR (IV SOLUTION) ×2 IMPLANT

## 2023-11-21 NOTE — Anesthesia Procedure Notes (Signed)
 Procedure Name: Intubation Date/Time: 11/21/2023 12:12 PM  Performed by: Obadiah Reyes BROCKS, CRNAPre-anesthesia Checklist: Patient identified, Emergency Drugs available, Suction available and Patient being monitored Patient Re-evaluated:Patient Re-evaluated prior to induction Oxygen Delivery Method: Circle System Utilized Preoxygenation: Pre-oxygenation with 100% oxygen Induction Type: IV induction Ventilation: Mask ventilation without difficulty Laryngoscope Size: Miller and 2 Grade View: Grade I Tube type: Oral Number of attempts: 1 Airway Equipment and Method: Stylet and Oral airway Placement Confirmation: ETT inserted through vocal cords under direct vision, positive ETCO2 and breath sounds checked- equal and bilateral Secured at: 23 cm Tube secured with: Tape Dental Injury: Teeth and Oropharynx as per pre-operative assessment

## 2023-11-21 NOTE — Anesthesia Preprocedure Evaluation (Addendum)
 Anesthesia Evaluation  Patient identified by MRN, date of birth, ID band Patient awake    Reviewed: Allergy & Precautions, NPO status , Patient's Chart, lab work & pertinent test results  Airway Mallampati: II  TM Distance: <3 FB Neck ROM: Full    Dental  (+) Teeth Intact, Dental Advisory Given   Pulmonary former smoker   breath sounds clear to auscultation       Cardiovascular + CAD   Rhythm:Regular Rate:Normal     Neuro/Psych negative neurological ROS  negative psych ROS   GI/Hepatic negative GI ROS, Neg liver ROS,,,  Endo/Other  negative endocrine ROS    Renal/GU negative Renal ROS     Musculoskeletal negative musculoskeletal ROS (+)    Abdominal   Peds  Hematology negative hematology ROS (+)   Anesthesia Other Findings   Reproductive/Obstetrics                              Anesthesia Physical Anesthesia Plan  ASA: 2  Anesthesia Plan: General   Post-op Pain Management: Tylenol  PO (pre-op)* and Toradol IV (intra-op)*   Induction: Intravenous  PONV Risk Score and Plan: 3 and Ondansetron , Dexamethasone  and Midazolam   Airway Management Planned: Oral ETT  Additional Equipment: None  Intra-op Plan:   Post-operative Plan: Extubation in OR  Informed Consent: I have reviewed the patients History and Physical, chart, labs and discussed the procedure including the risks, benefits and alternatives for the proposed anesthesia with the patient or authorized representative who has indicated his/her understanding and acceptance.     Dental advisory given  Plan Discussed with: CRNA  Anesthesia Plan Comments:          Anesthesia Quick Evaluation

## 2023-11-21 NOTE — Op Note (Addendum)
 Preoperative diagnosis: Urothelial carcinoma of the left ureter  Postoperative diagnosis: Urothelial carcinoma of the left ureter  Procedure:  Left robotic-assisted laparoscopic nephroureterectomy Postoperative intravesical instillation of gemcitabine   Surgeon: Gretel CANDIE Renda Mickey. M.D.   Assistant(s): Alan Hammonds, PA-C  An assistant was required for this surgical procedure.  The duties of the assistant included but were not limited to suctioning, passing suture, camera manipulation, retraction. This procedure would not be able to be performed without an Geophysicist/field seismologist.   Anesthesia: General   Complications: None  EBL: 100  IVF: 1400 mL crystalloid   Specimens:  Left kidney, ureter, and bladder cuff  Disposition of specimens: Pathology   Drains:  # 15 Blake pelvic drain 18 Fr Foley catheter  Indication:   Luis Pruitt is a 63 y.o. patient with upper tract urothelial carcinoma of the left ureter. After a thorough review of the management options, he elected to proceed with surgical treatment and the above procedure. We have discussed the potential benefits and risks of the procedure, side effects of the proposed treatment, the likelihood of the patient achieving the goals of the procedure, and any potential problems that might occur during the procedure or recuperation. Informed consent has been obtained.   Description of procedure:   The patient was taken to the operating room and a general anesthetic was administered. The patient was given preoperative antibiotics, placed in the right modified flank position with care to pad all potential pressure points, and prepped and draped in the usual sterile fashion. Next a preoperative timeout was performed.   A site was selected in the upper midline for placement of the camera port. This was placed using a standard open Hassan technique which allowed entry into the peritoneal cavity under direct vision and without difficulty. A 12  mm port was placed and a pneumoperitoneum established. The camera was then used to inspect the abdomen and there was no evidence of any intra-abdominal injuries or other abnormalities. The remaining abdominal ports were then placed. 8 mm robotic ports were placed in the left upper quadrant, left lower quadrant, and far left lateral abdominal wall. All ports were placed under direct vision without difficulty.   Utilizing the cautery scissors, the white line of Toldt was incised allowing the colon to be mobilized medially and the plane between the mesocolon and the anterior layer of Gerota's fascia to be developed and the kidney to be exposed. The ureter and gonadal vein were identified inferiorly and the ureter was identified but was severely stuck with a significant desmoplastic reaction.  This was carefully dissected and then lifted anteriorly off the psoas muscle. Dissection proceeded superiorly along the gonadal vein until it entered the inferior vena cava. The gonadal vein was divided after ligation with multiple Weck clips. The renal hilum was then encountered. There was a single renal artery and vein.  The artery was ligated with multiple Weck clips and divided.  The vein was ligated with a stapler.  Gerota's fascia was then intentionally entered superiorly to spare the adrenal gland and the hepatorenal ligaments were divided. The lateral attachments to the kidney were then divided allowing the kidney to be freely mobile. Dissection then proceeded inferiorly along the ureter toward the pelvis. The gonadal vein was ligated and divided near the inguinal ring. The ureter was dissected free down the the common iliac vessels.   At this point attention returned to the renal fossa. Hemostasis was ensured.  Attention then focused on the pelvic dissection. The robotic cart  was undocked. An additional 8 mm robotic port was placed in the lower midline and the cart was redocked over the patient's hip. The ureter was  further dissected freely into the pelvis after the overlying peritoneum was incised and the small vessels feeding the ureter was ligated with bipolar energy. A Weck clip was placed on the ureter distally to avoid any risk of tumor spillage. The detrusor muscle fibers were divided and the mucosa could be visualized. A 3-0 V-lock suture was secured at the lateral side of the margin of resection in the bladder. The mucosa was then entered and the ureter and a surrounding bladder cuff were removed. The cystotomy was then closed with the v-lock suture in a running fashion with a second imbricating layer as well. The bladder was then filled with saline and there was no evidence for urine leak.   A # 15 Blake drain was then brought through the lateral lower port site and positioned in the perivesical space. The specimen was placed in an Endocatch II bag and later removed through a lower midline incision extending from the robotic port site in that area. The 12 mm upper midline incision was closed with a 0-vicryl suture laparoscopically and the camera port site was closed with a figure of eight 0- vicryl suture. The lower midline extraction site was closed with two running # 1 PDS sutures. All other laparoscopic/robotic ports were removed under direct vision and the pneumoperitoneum let down with inspection of the operative field performed and hemostasis again confirmed. All incision sites were then injected with local anesthetic and reapproximated at the skin level with 4-0 monocryl subcuticular closures. Dermabond was applied to the skin. The patient tolerated the procedure well and without complications. The patient was able to be extubated and transferred to the recovery unit in satisfactory condition.   In the PACU, 2000 mg of intravesical gemcitabine  was instilled and left indwelling for one hour.   Gretel CANDIE Renda Teddie MD

## 2023-11-21 NOTE — Transfer of Care (Signed)
 Immediate Anesthesia Transfer of Care Note  Patient: Luis Pruitt  Procedure(s) Performed: NEPHROURETERECTOMY, ROBOT-ASSISTED, LAPAROSCOPIC (Left) INSTILLATION, BLADDER  Patient Location: PACU  Anesthesia Type:General  Level of Consciousness: sedated and responds to stimulation  Airway & Oxygen Therapy: Patient Spontanous Breathing and Patient connected to face mask oxygen  Post-op Assessment: Report given to RN and Post -op Vital signs reviewed and stable  Post vital signs: Reviewed and stable  Last Vitals:  Vitals Value Taken Time  BP 143/72 11/21/23 16:30  Temp    Pulse 58 11/21/23 16:33  Resp 11 11/21/23 16:33  SpO2 100 % 11/21/23 16:33  Vitals shown include unfiled device data.  Last Pain:  Vitals:   11/21/23 0918  TempSrc:   PainSc: 0-No pain      Patients Stated Pain Goal: 4 (11/21/23 9081)  Complications: No notable events documented.

## 2023-11-21 NOTE — Interval H&P Note (Signed)
 History and Physical Interval Note:  11/21/2023 11:05 AM  Luis Pruitt  has presented today for surgery, with the diagnosis of UROTHELIAL CARCINOMA OF THE LEFT URETER.  The various methods of treatment have been discussed with the patient and family. After consideration of risks, benefits and other options for treatment, the patient has consented to  Procedure(s) with comments: NEPHROURETERECTOMY, ROBOT-ASSISTED, LAPAROSCOPIC (Left) INSTILLATION, BLADDER (N/A) - will instill Gemcitabine  in PACU as a surgical intervention.  The patient's history has been reviewed, patient examined, no change in status, stable for surgery.  I have reviewed the patient's chart and labs.  Questions were answered to the patient's satisfaction.     [employer]

## 2023-11-21 NOTE — Anesthesia Postprocedure Evaluation (Addendum)
 Anesthesia Post Note  Patient: Luis Pruitt  Procedure(s) Performed: NEPHROURETERECTOMY, ROBOT-ASSISTED, LAPAROSCOPIC (Left) INSTILLATION, BLADDER     Patient location during evaluation: PACU Anesthesia Type: General Level of consciousness: awake and alert Pain management: pain level controlled Vital Signs Assessment: post-procedure vital signs reviewed and stable Respiratory status: spontaneous breathing, nonlabored ventilation, respiratory function stable and patient connected to nasal cannula oxygen Cardiovascular status: blood pressure returned to baseline and stable Postop Assessment: no apparent nausea or vomiting Anesthetic complications: no   No notable events documented.  Last Vitals:  Vitals:   11/21/23 1815 11/21/23 1851  BP: (!) 160/70 (!) 153/85  Pulse: (!) 56 65  Resp: 11 17  Temp:  (!) 36.4 C  SpO2: 100% 100%                 Franky JONETTA Bald

## 2023-11-21 NOTE — Discharge Instructions (Signed)

## 2023-11-22 ENCOUNTER — Encounter (HOSPITAL_COMMUNITY): Payer: Self-pay | Admitting: Urology

## 2023-11-22 LAB — BASIC METABOLIC PANEL WITH GFR
Anion gap: 8 (ref 5–15)
BUN: 19 mg/dL (ref 8–23)
CO2: 26 mmol/L (ref 22–32)
Calcium: 8.4 mg/dL — ABNORMAL LOW (ref 8.9–10.3)
Chloride: 100 mmol/L (ref 98–111)
Creatinine, Ser: 1.52 mg/dL — ABNORMAL HIGH (ref 0.61–1.24)
GFR, Estimated: 51 mL/min — ABNORMAL LOW (ref >60)
Glucose, Bld: 200 mg/dL — ABNORMAL HIGH (ref 70–99)
Potassium: 5.3 mmol/L — ABNORMAL HIGH (ref 3.5–5.1)
Sodium: 134 mmol/L — ABNORMAL LOW (ref 135–145)

## 2023-11-22 LAB — CREATININE, FLUID (PLEURAL, PERITONEAL, JP DRAINAGE): Creat, Fluid: 1.4 mg/dL

## 2023-11-22 LAB — HEMOGLOBIN AND HEMATOCRIT, BLOOD
HCT: 37.5 % — ABNORMAL LOW (ref 39.0–52.0)
Hemoglobin: 12.7 g/dL — ABNORMAL LOW (ref 13.0–17.0)

## 2023-11-22 MED ORDER — BISACODYL 10 MG RE SUPP
10.0000 mg | Freq: Once | RECTAL | Status: AC
  Administered 2023-11-22: 10 mg via RECTAL
  Filled 2023-11-22: qty 1

## 2023-11-22 MED ORDER — SIMETHICONE 80 MG PO CHEW
40.0000 mg | CHEWABLE_TABLET | Freq: Four times a day (QID) | ORAL | Status: DC | PRN
  Administered 2023-11-22: 80 mg via ORAL
  Filled 2023-11-22: qty 1

## 2023-11-22 MED ORDER — CHLORHEXIDINE GLUCONATE CLOTH 2 % EX PADS
6.0000 | MEDICATED_PAD | Freq: Every day | CUTANEOUS | Status: DC
  Administered 2023-11-22: 6 via TOPICAL

## 2023-11-22 NOTE — TOC Initial Note (Signed)
 Transition of Care [facility]) - Initial/Assessment Note    Patient Details  Name: Luis Pruitt MRN: [id number] Date of Birth: 07/29/1960  [facility]) CM/SW Contact:    Bascom Service, RN Phone Number: 11/22/2023, 10:12 AM  Clinical Narrative: d/c home.                  Expected Discharge Plan: Home/Self Care Barriers to Discharge: No Barriers Identified   Patient Goals and CMS Choice Patient states their goals for this hospitalization and ongoing recovery are:: Home CMS Medicare.gov Compare Post Acute Care list provided to:: Patient Choice offered to / list presented to : Patient [place] ownership interest in [facility] [employer].provided to:: Patient    Expected Discharge Plan and Services                                              Prior Living Arrangements/Services                       Activities of Daily Living   ADL Screening (condition at time of admission) Independently performs ADLs?: Yes (appropriate for developmental age) Is the patient deaf or have difficulty hearing?: No Does the patient have difficulty seeing, even when wearing glasses/contacts?: No Does the patient have difficulty concentrating, remembering, or making decisions?: No  Permission Sought/Granted                  Emotional Assessment              Admission diagnosis:  Urothelial carcinoma of left distal ureter (HCC) [C66.2] Ureteral neoplasm [D49.59] Patient Active Problem List   Diagnosis Date Noted   Ureteral neoplasm 11/21/2023   Coronary artery calcification seen on CAT scan 10/18/2023   Family hx of colon cancer 04/03/2017   PCP:  Sheryle Carwin, MD Pharmacy:   [facility] - [place], [place] - [address] [place] [address] Phone: [phone] Fax: [phone]  [id number] - [place], [place] - [address]  [place]  [address] Phone: [phone] Fax: [phone]     Social Drivers of Health (SDOH) Social History: SDOH Screenings   Food Insecurity: No Food Insecurity (11/21/2023)  Housing: Low Risk  (11/21/2023)  Transportation Needs: No Transportation Needs (11/21/2023)  Utilities: Not At Risk (11/21/2023)  Tobacco Use: Medium Risk (11/21/2023)   SDOH Interventions:     Readmission Risk Interventions     No data to display

## 2023-11-22 NOTE — Discharge Summary (Signed)
  Date of admission: 11/21/2023  Date of discharge: 11/22/2023  Admission diagnosis: Urothelial carcinoma of the left ureter  Discharge diagnosis: Urothelial carcinoma of the left ureter  Secondary diagnoses: Hypertension  History and Physical: For full details, please see admission history and physical. Briefly, Luis Pruitt is a 63 y.o. year old patient with gross hematuria who was found to have a urothelial cancer of the proximal left ureter and elected definitive nephroureterectomy for treatment.   Hospital Course: He underwent a left robot-assisted laparoscopic nephroureterectomy on 11/21/2023.  He tolerated this procedure well without complications.  He was able to begin ambulating postoperatively and his diet was gradually advanced.  He was transition to oral pain medication.  His drain creatinine was noted to be consistent with serum and was removed on postoperative day 1.  He was stable for discharge on the afternoon of postoperative day 1.  Laboratory values:  Recent Labs    11/21/23 1723 11/22/23 0439  HGB 13.8 12.7*  HCT 42.4 37.5*   Recent Labs    11/22/23 0439  CREATININE 1.52*    Disposition: Home  Discharge instruction: The patient was instructed to be ambulatory but told to refrain from heavy lifting, strenuous activity, or driving.   Discharge medications:   Followup:   Follow-up Information     Renda Glance, MD Follow up on 11/26/2023.   Specialty: Urology Why: at 9:45 Contact information: [address] [place] [address] [phone]

## 2023-11-22 NOTE — Progress Notes (Signed)
 Patient ID: Luis Pruitt, male   DOB: [birth year], 63 y.o.   MRN: [id number]  1 Day Post-Op Subjective: Did well overnight ambulating. Pain controlled.  Objective: Vital signs in last 24 hours: Temp:  [97.4 F (36.3 C)-98.1 F (36.7 C)] 97.4 F (36.3 C) (07/18 0625) Pulse Rate:  [54-65] 60 (07/18 0625) Resp:  [9-20] 18 (07/18 0625) BP: (113-173)/(55-86) 113/56 (07/18 0625) SpO2:  [95 %-100 %] 97 % (07/18 0625) Weight:  [92.2 kg] 92.2 kg (07/17 0856)  Intake/Output from previous day: 07/17 0701 - 07/18 0700 In: 2191.7 [P.O.:360; I.V.:1831.7] Out: 1635 [Urine:1500; Drains:35; Blood:100] Intake/Output this shift: No intake/output data recorded.  Physical Exam:  General: Alert and oriented Abdomen: Soft, ND Incisions: C/D/I Ext: NT, No erythema  Lab Results: Recent Labs    11/19/23 0928 11/21/23 1723 11/22/23 0439  HGB 13.7 13.8 12.7*  HCT 40.9 42.4 37.5*   BMET Recent Labs    11/19/23 0928 11/22/23 0439  NA 138 134*  K 4.4 5.3*  CL 105 100  CO2 24 26  GLUCOSE 106* 200*  BUN 12 19  CREATININE 1.04 1.52*  CALCIUM  9.3 8.4*     Studies/Results: No results found.  Assessment/Plan: POD # 1 s/p left RAL nephroureterectomy - Ambulate, IS, SL IVF - Oral pain meds - Catheter teaching - Dulcolax suppository - Check drain Cr - Plan for discharge this afternoon if doing well   LOS: 1 day   Noretta Ferrara 11/22/2023, 7:35 AM

## 2023-11-25 ENCOUNTER — Telehealth: Payer: Self-pay

## 2023-11-25 NOTE — Transitions of Care (Post Inpatient/ED Visit) (Signed)
   11/25/2023  Name: Luis Pruitt MRN: [id number] DOB: [birth year]  Today's TOC FU Call Status: Today's TOC FU Call Status:: Successful TOC FU Call Completed TOC FU Call Complete Date: 11/25/23 Patient's Name and Date of Birth confirmed.  Transition Care Management Follow-up Telephone Call Date of Discharge: 11/22/23 Discharge Facility: [facility]) Type of Discharge: Inpatient Admission Primary Inpatient Discharge Diagnosis:: Urothelial carcinoma of the left ureter How have you been since you were released from the hospital?: Better Any questions or concerns?: No  Items Reviewed: Did you receive and understand the discharge instructions provided?: Yes Medications obtained,verified, and reconciled?: Yes (Medications Reviewed) Any new allergies since your discharge?: No Dietary orders reviewed?: Yes Type of Diet Ordered:: Heart Healthy Do you have support at home?: Yes People in Home [RPT]: spouse Name of Support/Comfort Primary Source: [name]  Medications Reviewed Today: Medications Reviewed Today     Reviewed by Salah Nakamura, RN (Case Manager) on 11/25/23 at 1216  Med List Status: <None>   Medication Order Taking? Sig Documenting Provider Last Dose Status Informant  acetaminophen  (TYLENOL ) 500 MG tablet [id number] Yes Take 1,000 mg by mouth every 6 (six) hours as needed for moderate pain (pain score 4-6). [provider]  Active Self  docusate sodium  (COLACE) 100 MG capsule [id number] Yes Take 1 capsule (100 mg total) by mouth 2 (two) times daily. Cory Palma, PA-C  Active   rosuvastatin  (CRESTOR ) 10 MG tablet [id number] Yes Take 10 mg by mouth at bedtime. [provider]  Active Self  sulfamethoxazole -trimethoprim  (BACTRIM  DS) 800-160 MG tablet [id number] Yes Take 1 tablet by mouth 2 (two) times daily. Start the day prior to foley removal appointment Cory Palma, PA-C  Active   traMADol  (ULTRAM ) 50 MG tablet [id number] Yes Take 1-2 tablets (50-100 mg  total) by mouth every 6 (six) hours as needed for moderate pain (pain score 4-6) or severe pain (pain score 7-10). Cory Palma, PA-C  Active             Home Care and Equipment/Supplies: Were Home Health Services Ordered?: NA Any new equipment or medical supplies ordered?: NA  Functional Questionnaire: Do you need assistance with bathing/showering or dressing?: No Do you need assistance with meal preparation?: No Do you need assistance with eating?: No Do you have difficulty maintaining continence: No Do you need assistance with getting out of bed/getting out of a chair/moving?: No Do you have difficulty managing or taking your medications?: No  Follow up appointments reviewed: PCP Follow-up appointment confirmed?: No (patient to call) MD Provider Line [phone] Given: No Specialist Hospital Follow-up appointment confirmed?: Yes Date of Specialist follow-up appointment?: 11/26/23 Follow-Up Specialty Provider:: Dr. Renda Do you need transportation to your follow-up appointment?: No Do you understand care options if your condition(s) worsen?: Yes-patient verbalized understanding  SDOH Interventions Today    Flowsheet Row Most Recent Value  SDOH Interventions   Food Insecurity Interventions Intervention Not Indicated  Housing Interventions Intervention Not Indicated  Transportation Interventions Intervention Not Indicated  Utilities Interventions Intervention Not Indicated   Eileene Kisling J. Haillie Radu RN, MSN [facility]  [facility], [facility] RN Care Manager Direct Dial: [phone]  Fax: [phone] Website: [email or web address]

## 2023-11-25 NOTE — Patient Instructions (Signed)
 Visit Information  Thank you for taking time to visit with me today. Please don't hesitate to contact me if I can be of assistance to you before our next scheduled telephone appointment.  Follow up with physician as scheduled 11/26/23 Take antibiotics as prescribed Monitor for swelling, redness, pain, pus, and fever Keep wound clean and dry.   Notify physician immediately for any changes  Take stool softeners for constipation Drink plenty of fluids Increase fiber intake.   Patient verbalizes understanding of instructions and care plan provided today and agrees to view in MyChart. Active MyChart status and patient understanding of how to access instructions and care plan via MyChart confirmed with patient.     The patient has been provided with contact information for the care management team and has been advised to call with any health related questions or concerns.      Please call the Suicide and Crisis Lifeline: 988 if you are experiencing a Mental Health or Behavioral Health Crisis or need someone to talk to.  Cris Gibby J. Josmar Messimer RN, MSN [facility], [facility] RN Care Manager Direct Dial: [phone]  Fax: [phone] Website: [email or web address]

## 2023-11-26 DIAGNOSIS — C662 Malignant neoplasm of left ureter: Secondary | ICD-10-CM | POA: Diagnosis not present

## 2023-11-28 LAB — SURGICAL PATHOLOGY

## 2023-12-24 DIAGNOSIS — C662 Malignant neoplasm of left ureter: Secondary | ICD-10-CM | POA: Diagnosis not present

## 2023-12-24 DIAGNOSIS — N50812 Left testicular pain: Secondary | ICD-10-CM | POA: Diagnosis not present

## 2024-02-11 DIAGNOSIS — M5136 Other intervertebral disc degeneration, lumbar region with discogenic back pain only: Secondary | ICD-10-CM | POA: Diagnosis not present

## 2024-02-11 DIAGNOSIS — Z8551 Personal history of malignant neoplasm of bladder: Secondary | ICD-10-CM | POA: Diagnosis not present

## 2024-02-17 ENCOUNTER — Other Ambulatory Visit (HOSPITAL_COMMUNITY): Payer: Self-pay | Admitting: Internal Medicine

## 2024-02-17 DIAGNOSIS — M519 Unspecified thoracic, thoracolumbar and lumbosacral intervertebral disc disorder: Secondary | ICD-10-CM

## 2024-02-19 ENCOUNTER — Ambulatory Visit (HOSPITAL_COMMUNITY)

## 2024-02-25 DIAGNOSIS — C662 Malignant neoplasm of left ureter: Secondary | ICD-10-CM | POA: Diagnosis not present

## 2024-02-26 ENCOUNTER — Encounter (HOSPITAL_COMMUNITY): Payer: Self-pay

## 2024-02-26 ENCOUNTER — Ambulatory Visit (HOSPITAL_COMMUNITY): Admission: RE | Admit: 2024-02-26 | Source: Ambulatory Visit

## 2024-02-29 ENCOUNTER — Ambulatory Visit (HOSPITAL_COMMUNITY)
Admission: RE | Admit: 2024-02-29 | Discharge: 2024-02-29 | Disposition: A | Source: Ambulatory Visit | Attending: Internal Medicine | Admitting: Internal Medicine

## 2024-02-29 DIAGNOSIS — M519 Unspecified thoracic, thoracolumbar and lumbosacral intervertebral disc disorder: Secondary | ICD-10-CM | POA: Diagnosis not present

## 2024-02-29 DIAGNOSIS — M48061 Spinal stenosis, lumbar region without neurogenic claudication: Secondary | ICD-10-CM | POA: Diagnosis not present

## 2024-02-29 DIAGNOSIS — M5136 Other intervertebral disc degeneration, lumbar region with discogenic back pain only: Secondary | ICD-10-CM | POA: Diagnosis not present

## 2024-02-29 DIAGNOSIS — Z85528 Personal history of other malignant neoplasm of kidney: Secondary | ICD-10-CM | POA: Diagnosis not present

## 2024-02-29 MED ORDER — GADOBUTROL 1 MMOL/ML IV SOLN
9.0000 mL | Freq: Once | INTRAVENOUS | Status: AC | PRN
Start: 2024-02-29 — End: 2024-02-29
  Administered 2024-02-29: 9 mL via INTRAVENOUS

## 2024-03-16 DIAGNOSIS — H2513 Age-related nuclear cataract, bilateral: Secondary | ICD-10-CM | POA: Diagnosis not present
